# Patient Record
Sex: Male | Born: 1960 | Race: White | Hispanic: No | Marital: Married
Health system: Midwestern US, Community
[De-identification: ages and names within clinical notes are randomized; demographics above are authoritative.]

## PROBLEM LIST (undated history)

## (undated) DIAGNOSIS — L989 Disorder of the skin and subcutaneous tissue, unspecified: Secondary | ICD-10-CM

## (undated) DIAGNOSIS — F331 Major depressive disorder, recurrent, moderate: Secondary | ICD-10-CM

## (undated) DIAGNOSIS — F419 Anxiety disorder, unspecified: Secondary | ICD-10-CM

## (undated) DIAGNOSIS — Z5900 Homelessness unspecified: Secondary | ICD-10-CM

## (undated) DIAGNOSIS — Z20822 Contact with and (suspected) exposure to covid-19: Secondary | ICD-10-CM

## (undated) DIAGNOSIS — F32A Depression, unspecified: Secondary | ICD-10-CM

## (undated) HISTORY — DX: Depression, unspecified: F32.A

---

## 2004-07-14 ENCOUNTER — Ambulatory Visit: Payer: Self-pay | Admitting: Family Medicine

## 2005-03-04 ENCOUNTER — Ambulatory Visit: Payer: Self-pay | Admitting: Family Medicine

## 2012-09-21 ENCOUNTER — Ambulatory Visit: Payer: BC Managed Care – PPO | Admitting: Physical Therapy

## 2012-09-21 DIAGNOSIS — M25639 Stiffness of unspecified wrist, not elsewhere classified: Secondary | ICD-10-CM | POA: Insufficient documentation

## 2012-09-21 DIAGNOSIS — R5381 Other malaise: Secondary | ICD-10-CM | POA: Insufficient documentation

## 2012-09-21 DIAGNOSIS — M25649 Stiffness of unspecified hand, not elsewhere classified: Secondary | ICD-10-CM | POA: Insufficient documentation

## 2012-09-21 DIAGNOSIS — IMO0001 Reserved for inherently not codable concepts without codable children: Secondary | ICD-10-CM | POA: Insufficient documentation

## 2012-09-21 DIAGNOSIS — M25539 Pain in unspecified wrist: Secondary | ICD-10-CM | POA: Insufficient documentation

## 2012-09-21 DIAGNOSIS — M6281 Muscle weakness (generalized): Secondary | ICD-10-CM | POA: Insufficient documentation

## 2012-09-21 DIAGNOSIS — R209 Unspecified disturbances of skin sensation: Secondary | ICD-10-CM | POA: Insufficient documentation

## 2012-09-26 ENCOUNTER — Ambulatory Visit: Payer: BC Managed Care – PPO | Attending: Orthopedic Surgery | Admitting: Physical Therapy

## 2012-09-28 ENCOUNTER — Ambulatory Visit: Payer: BC Managed Care – PPO

## 2012-10-02 ENCOUNTER — Ambulatory Visit: Payer: BC Managed Care – PPO

## 2012-10-04 ENCOUNTER — Encounter: Payer: BC Managed Care – PPO | Admitting: Physical Therapy

## 2013-11-19 DIAGNOSIS — M25571 Pain in right ankle and joints of right foot: Secondary | ICD-10-CM | POA: Insufficient documentation

## 2014-01-14 DIAGNOSIS — N200 Calculus of kidney: Secondary | ICD-10-CM | POA: Insufficient documentation

## 2017-11-16 NOTE — Telephone Encounter (Signed)
Pt name and DOB verified.    Name of caller and relationship? Pt's wife Stephen Meadows     Who is your current or most recent primary care provider and which hospital where they are affiliated with? [facility]     How long did you see them for? About 1 year    Is there a reason that you are looking for a new provider? Needs provider closer to home.     Would you like to provide your e-mail to sign up for our My Chart system? Yes, [email or web address]     The benefits of being a My Chart member are:    -Request medical appointments  -View your health summary from the My Chart electronic health record.  -View test results.  -Request prescription refills  -Communicate electronically and securely with your medical care team.    Would you like a text message or letter with your activation code? Yes - Text      When was your last Annual or Well Child Exam? Earlier this year , Please keep in mind that this new patient appointment may not contain an annual exam.     Do you have any ongoing or chronic conditions you are currently being treated   for (e.g. Hypertension, Diabetes, Depression, etc)? If yes list: yes, broken rt foot, complex regional pain syndrome, pt has a spinal stimulator, borderline diabetes, depression and diverticulitis.     Have you been seen by a provider for these chronic conditions in the last 6 months? If so, who? yes    Are you on any medicines that require a special prescription such as a sleeping pills, pain medication or stimulants? NO  (if no go ahead and book appointment, if yes please read the following:    "Please be aware that our provider's may not prescribe these medications at the first visit as they need to get to know more about you and your medical problems/history before they will safely prescribe these" Informed  yes    Do you need an appointment to establish care or is there something more    urgent you need to be seen for? Explain: Establish and depression.     Do you have a provider preference such as male or male? No preference.     Do you have any questions about what we discussed? no  (If no go ahead and book, if yes please get the details and send to the practice)    Your new patient appointment is booked on 05/08/18 at 9:45 with Robin Dobrinick.     We do ask if you're not able to keep this appointment please call our office within 24 hours to reschedule to a more convenient time which also allows us the opportunity free up the slot for another patient.       **Thank you for choosing us for your health care needs.   [name] I do want to let you know that you will need to contact your insurance company and make them aware of your new Primary Care Provider which is Dobrinick, Jamison Neighborobin A, FNP. Also [name] a new patient packet will be coming to you. Would you like to have this release of information faxed to you? yes If faxed what is the number? [phone].  If not, you can stop by our practice to complete the form or we can mail you the form.  We would greatly appreciate it if you could complete the release of records and either mail back or drop off at our office within 7 days of receipt.    Notes: Pt has Medicare and [employer] for insurance. Pt will bring his insurance cards to the appt.     CB#: [phone]     Sol Blazing

## 2017-11-17 NOTE — Telephone Encounter (Signed)
NP packet sent, appointment date and time confirmed. I will forward to care teas as FYI.

## 2017-11-24 LAB — HM COLONOSCOPY

## 2018-03-10 NOTE — Telephone Encounter (Signed)
Pt name and DOB verified.    Pt's wife, [name] calling in to see if pt can be seen any sooner than his NP appt. Wife states pt is extremely depressed. Wife denies pt having an suicidal or homicidal thoughts.    Writer did place NP appt on wait list.    Please advise and call [name] back.    [phone] - Stephen Meadows

## 2018-03-10 NOTE — Telephone Encounter (Signed)
Spoke with wife about pt's depression. He broke his foot and was put into a cast then needed to have screws put in his foot which put him out of helping around the house for another 8 weeks.    Pt feels like he cannot help around the house and is getting depressed. He is not having any suicidal thoughts.  I informed wife we did not have any availability to get pt in sooner but I did tell her I was going to put him on the waitlist and make sure he is the first person we call if we do have a cancellation.     She voiced good understanding and will wait for a call.

## 2018-03-13 NOTE — Telephone Encounter (Addendum)
I spoke with Stephen Meadows his wife and I let her know he could be seen for his depression only next week and then he can keep his appt on 5/4 for his NP appointment and she acknowledged.

## 2018-03-22 ENCOUNTER — Encounter: Attending: Family | Primary: Family

## 2018-03-22 ENCOUNTER — Ambulatory Visit: Admit: 2018-03-22 | Discharge: 2018-03-22 | Payer: MEDICARE | Attending: Family | Primary: Family

## 2018-03-22 ENCOUNTER — Ambulatory Visit: Attending: Family

## 2018-03-22 DIAGNOSIS — F331 Major depressive disorder, recurrent, moderate: Secondary | ICD-10-CM

## 2018-03-22 MED ORDER — VENLAFAXINE 37.5 MG TAB
37.5 mg | ORAL_TABLET | Freq: Three times a day (TID) | ORAL | 3 refills | Status: DC
Start: 2018-03-22 — End: 2019-01-02

## 2018-03-22 MED ORDER — BUPROPION XL 150 MG 24 HR TAB
150 mg | ORAL_TABLET | ORAL | 1 refills | Status: DC
Start: 2018-03-22 — End: 2018-12-26

## 2018-03-22 NOTE — Progress Notes (Signed)
[facility]   [address] Stephen Meadows  [place] [address]  [phone]    CHIEF COMPLAINT     Stephen Meadows is a 58 y.o. male who presents to the office today for an acute visit for Depression.    HPI     I had the pleasure of meeting Stephen Meadows today. He is a pleasant 32 year old caucasian male with a history of depression and complex regional pain syndrome. He presents for evaluation of depression. He is accompanied by his wife.     Stephen Meadows has been taking Effexor 37.5mg  three times a day for years to manage his depression. He feels it works well for him and says he feels physically sick if he misses a dose. He feels his symptoms are worsening over the past several months. He fell off a ladder and shattered his right heel. He had surgery to repair it and feels frustrated that he can't help his wife around the house. He is feeling very mood and irritable. He has never been violent and denies SI or HI, but he says people get scared of him when he is angry. He feels he has a lack of energy. He sleeps well all night, but still feels bored and tired during the day time. He doesn't feel like doing anything. He enjoys riding motorcycles and doing outside work- and is missing that. He says he wakes up on the wrong side of the bed most mornings and isn't sure why he is in such a bad mood.    His wife thinks he may be on the spectrum. She says he has always exhibited odd behavior- particular with social interactions. He doesn't like to be around people. He doesn't understand sarcasm. She has to be very careful how she words things so as not to upset him. He adheres to a strict schedule and has a hard time coping with sudden change. Sometimes he'll do things like reach over and twirl her hair when they are in a meeting or appointment together. The other night they had a dinner party and he sat in his chair the whole time. She is requesting a neuropsychiatric evaluation. Maddox does  not object to this. He admits that he has a hard time trusting and relating to people. He denies any prior diagnosis of ASD or history of psychiatric admissions. He has a high school education.     3 most recent PHQ Screens 03/22/2018   Little interest or pleasure in doing things Nearly every day   Feeling down, depressed, irritable, or hopeless More than half the days   Total Score PHQ 2 5   Trouble falling or staying asleep, or sleeping too much More than half the days   Feeling tired or having little energy More than half the days   Poor appetite, weight loss, or overeating More than half the days   Feeling bad about yourself - or that you are a failure or have let yourself or your family down More than half the days   Trouble concentrating on things such as school, work, reading, or watching TV More than half the days   Moving or speaking so slowly that other people could have noticed; or the opposite being so fidgety that others notice Nearly every day   Thoughts of being better off dead, or hurting yourself in some way Not at all   PHQ 9 Score 18   How difficult have these problems made it  for you to do your work, take care of your home and get along with others Very difficult     GAD 2/7 03/22/2018   Feeling nervous, anxious or on edge? 1   Not being able to stop or control worrying? 1   GAD-2 Subtotal 2     MEDICATIONS     Current Outpatient Medications   Medication Sig   ??? buPROPion XL (WELLBUTRIN XL) 150 mg tablet Take 1 Tab by mouth every morning. Indications: major depressive disorder   ??? venlafaxine (EFFEXOR) 37.5 mg tablet Take 1 Tab by mouth three (3) times daily. Indications: major depressive disorder     No current facility-administered medications for this visit.      Medications Discontinued During This Encounter   Medication Reason   ??? venlafaxine (EFFEXOR) 37.5 mg tablet Reorder       ALLERGIES     No Known Allergies    REVIEW OF SYSTEMS   See HPI    ACTIVE MEDICAL PROBLEMS     Patient Active  Problem List   Diagnosis Code   ??? Moderate episode of recurrent major depressive disorder (HCC) F33.1   ??? Prediabetes R73.03   ??? 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       SOCIAL HISTORY     Social History     Social History Narrative    Marital status: Married, Stephen Meadows - 9 years    Living situation: Own home in [place]     Pets in the home: 2 dogs, 1 cat     Children: None- 2 step children     Occupation: Disabled    Highest education: HS    Alcohol,tobacco, drug [id number] and marijuana use     Caffeine/ diet: Keto,  Decaf coffee    Exercise: None     Military- None              VITALS     Vitals:    03/22/18 1154   BP: 110/84   Pulse: 86   Resp: 12   SpO2: 96%   Weight: 188 lb 12.8 oz (85.6 kg)   Height: 5\' 9"  (1.753 m)     Body mass index is 27.88 kg/m??.    PHYSICAL EXAM     MENTAL STATUS EXAM:  General Appearance:   Neatly dressed, good hygiene, calm, cooperative, polite  Speech:   Coherent, non-pressured, regular tone rate and volume  Psychomotor:   Good eye contact, normal motor activity, no involuntary movements  Mood:   Euthymic  Affect:   Mood congruent  Thought Process:   Goal-directed and organized, without any evidence of mania or psychosis  Thought Content:   No AV hallucinations or delusional thoughts.  Suicidal Thoughts:   Denies any thoughts of suicide or self-harm.  Homicidal/Violent Thoughts:   Denies any homicidal or violent thoughts  Orientation:   Generally alert and oriented  Insight/Judgement:     Insight and judgment seem intact  Estimated Intelligence:   below average    LABS & IMAGING   No results found for this or any previous visit (from the past 24 hour(s)).    ORDERS & MEDS (NEW)     Orders Placed This Encounter   ??? INFLUENZA VIRUS VACCINE QUADRIVALENT, PRESERVATIVE FREE SYRINGE (50277)   ??? METABOLIC PANEL, COMPREHENSIVE     Standing Status:   Future     Standing Expiration Date:   03/22/2019   ??? HEMOGLOBIN A1C WITH EAG  Standing Status:   Future     Standing  Expiration Date:   03/22/2019   ??? LIPID PANEL W/ REFLX DIRECT LDL     Standing Status:   Future     Standing Expiration Date:   03/22/2019   ??? TSH CASCADE     Standing Status:   Future     Standing Expiration Date:   03/22/2019   ??? Referral to [facility]     Referral Priority:   Routine     Referral Type:   Behavioral Health     Referral Reason:   Specialty Services Required     Referral Location:   [facility]     Requested Specialty:   Psychology     Number of Visits Requested:   1   ??? REFERRAL TO BEHAVIORAL HEALTH     Referral Priority:   Routine     Referral Type:   Behavioral Health     Referral Reason:   Specialty Services Required     Number of Visits Requested:   1   ??? buPROPion XL (WELLBUTRIN XL) 150 mg tablet     Sig: Take 1 Tab by mouth every morning. Indications: major depressive disorder     Dispense:  90 Tab     Refill:  1   ??? venlafaxine (EFFEXOR) 37.5 mg tablet     Sig: Take 1 Tab by mouth three (3) times daily. Indications: major depressive disorder     Dispense:  270 Tab     Refill:  3     Handicap lids       ASSESSMENT AND PLAN     Diagnoses and all orders for this visit:    1. Moderate episode of recurrent major depressive disorder [facility])  Assessment & Plan:  Xhaiden reports feeling depressed. Predominant symptoms include lethargy and lack of motivation. He reports moodiness and irritability as well. He is sleeping well. He denies SI/HI and has no history of bipolar or psychiatric admissions. He recently quit smoking. I have added bupropion  to his regimen. If he notes increased agitation or irritability he will call. I have also referred him for counseling. He and his wife are requesting a full neuropsychiatric evaluation to rule out ASD. I have made the referral. Follow-up in two months.     Orders:  -     REFERRAL TO PSYCHOLOGY  -     buPROPion XL (WELLBUTRIN XL) 150 mg tablet; Take 1 Tab by mouth every morning. Indications:  major depressive disorder  -     REFERRAL TO BEHAVIORAL HEALTH  -     TSH CASCADE; Future  -     venlafaxine (EFFEXOR) 37.5 mg tablet; Take 1 Tab by mouth three (3) times daily. Indications: major depressive disorder    2. Encounter for immunization  -     INFLUENZA VIRUS VAC QUAD,SPLIT,PRESV FREE SYRINGE IM    3. Prediabetes  -     METABOLIC PANEL, COMPREHENSIVE; Future  -     HEMOGLOBIN A1C WITH EAG; Future    4. Lipid screening  -     LIPID PANEL W/ REFLX DIRECT LDL; Future    Follow-up and Dispositions    ?? Return if symptoms worsen or fail to improve.       Greater than 30 minutes were spent during this consultation of that greater than 50% of the time was spent in direct coordination of care and patient education for  the above listed problems.     Future Appointments   Date Time Provider Department Center   05/08/2018  9:45 AM Jashiya Bassett, Jamison Neighbor, FNP [facility]       Jamison Neighbor Aqil Goetting, FNP  03/22/2018

## 2018-03-22 NOTE — Assessment & Plan Note (Signed)
Stephen Meadows reports feeling depressed. Predominant symptoms include lethargy and lack of motivation. He reports moodiness and irritability as well. He is sleeping well. He denies SI/HI and has no history of bipolar or psychiatric admissions. He recently quit smoking. I have added bupropion 150mg  to his regimen. If he notes increased agitation or irritability he will call. I have also referred him for counseling. He and his wife are requesting a full neuropsychiatric evaluation to rule out ASD. I have made the referral. Follow-up in two months.

## 2018-03-22 NOTE — Progress Notes (Signed)
[facility] AT [name]   [address] [name]  [place] [address]  [phone]    CHIEF COMPLAINT     Stephen Meadows is a 58 y.o. male who presents to the office today for an acute visit for Depression.    HPI     I had the pleasure of meeting Stephen Meadows today. He is a pleasant 71 year old caucasian male with a history of depression and complex regional pain syndrome. He presents for evaluation of depression. He is accompanied by his wife.     Stephen Meadows has been taking Effexor 37.5mg  three times a day for years to manage his depression. He feels it works well for him and says he feels physically sick if he misses a dose. He feels his symptoms are worsening over the past several months. He fell off a ladder and shattered his right heel. He had surgery to repair it and feels frustrated that he can't help his wife around the house. He is feeling very mood and irritable. He has never been violent and denies SI or HI, but he says people get scared of him when he is angry. He feels he has a lack of energy. He sleeps well all night, but still feels bored and tired during the day time. He doesn't feel like doing anything. He enjoys riding motorcycles and doing outside work- and is missing that. He says he wakes up on the wrong side of the bed most mornings and isn't sure why he is in such a bad mood.    His wife thinks he may be on the spectrum. She says he has always exhibited odd behavior- particular with social interactions. He doesn't like to be around people. He doesn't understand sarcasm. She has to be very careful how she words things so as not to upset him. He adheres to a strict schedule and has a hard time coping with sudden change. Sometimes he'll do things like reach over and twirl her hair when they are in a meeting or appointment together. The other night they had a dinner party and he sat in his chair the whole time. She is requesting a  neuropsychiatric evaluation. Jigar does not object to this. He admits that he has a hard time trusting and relating to people. He denies any prior diagnosis of ASD or history of psychiatric admissions. He has a high school education.     3 most recent PHQ Screens 03/22/2018   Little interest or pleasure in doing things Nearly every day   Feeling down, depressed, irritable, or hopeless More than half the days   Total Score PHQ 2 5   Trouble falling or staying asleep, or sleeping too much More than half the days   Feeling tired or having little energy More than half the days   Poor appetite, weight loss, or overeating More than half the days   Feeling bad about yourself - or that you are a failure or have let yourself or your family down More than half the days   Trouble concentrating on things such as school, work, reading, or watching TV More than half the days   Moving or speaking so slowly that other people could have noticed; or the opposite being so fidgety that others notice Nearly every day   Thoughts of being better off dead, or hurting yourself in some way Not at all   PHQ 9 Score 18   How difficult have these problems made it  for you to do your work, take care of your home and get along with others Very difficult     GAD 2/7 03/22/2018   Feeling nervous, anxious or on edge? 1   Not being able to stop or control worrying? 1   GAD-2 Subtotal 2     MEDICATIONS     Current Outpatient Medications   Medication Sig   ??? buPROPion XL (WELLBUTRIN XL) 150 mg tablet Take 1 Tab by mouth every morning. Indications: major depressive disorder   ??? venlafaxine (EFFEXOR) 37.5 mg tablet Take 1 Tab by mouth three (3) times daily. Indications: major depressive disorder     No current facility-administered medications for this visit.      Medications Discontinued During This Encounter   Medication Reason   ??? venlafaxine (EFFEXOR) 37.5 mg tablet Reorder       ALLERGIES     No Known Allergies    REVIEW OF SYSTEMS   See HPI     ACTIVE MEDICAL PROBLEMS     Patient Active Problem List   Diagnosis Code   ??? Moderate episode of recurrent major depressive disorder (HCC) F33.1   ??? Prediabetes R73.03   ??? 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       SOCIAL HISTORY     Social History     Social History Narrative    Marital status: Married, Stephen Meadows - 9 years    Living situation: Own home in [place]     Pets in the home: 2 dogs, 1 cat     Children: None- 2 step children     Occupation: Disabled    Highest education: HS    Alcohol,tobacco, drug [id number] and marijuana use     Caffeine/ diet: Keto,  Decaf coffee    Exercise: None     Military- None              VITALS     Vitals:    03/22/18 1154   BP: 110/84   Pulse: 86   Resp: 12   SpO2: 96%   Weight: 188 lb 12.8 oz (85.6 kg)   Height: 5\' 9"  (1.753 m)     Body mass index is 27.88 kg/m??.    PHYSICAL EXAM     MENTAL STATUS EXAM:  General Appearance:   Neatly dressed, good hygiene, calm, cooperative, polite  Speech:   Coherent, non-pressured, regular tone rate and volume  Psychomotor:   Good eye contact, normal motor activity, no involuntary movements  Mood:   Euthymic  Affect:   Mood congruent  Thought Process:   Goal-directed and organized, without any evidence of mania or psychosis  Thought Content:   No AV hallucinations or delusional thoughts.  Suicidal Thoughts:   Denies any thoughts of suicide or self-harm.  Homicidal/Violent Thoughts:   Denies any homicidal or violent thoughts  Orientation:   Generally alert and oriented  Insight/Judgement:     Insight and judgment seem intact  Estimated Intelligence:   below average    LABS & IMAGING   No results found for this or any previous visit (from the past 24 hour(s)).    ORDERS & MEDS (NEW)     Orders Placed This Encounter   ??? INFLUENZA VIRUS VACCINE QUADRIVALENT, PRESERVATIVE FREE SYRINGE (77373)   ??? METABOLIC PANEL, COMPREHENSIVE     Standing Status:   Future     Standing Expiration Date:   03/22/2019   ??? HEMOGLOBIN A1C WITH EAG  Standing Status:   Future     Standing Expiration Date:   03/22/2019   ??? LIPID PANEL W/ REFLX DIRECT LDL     Standing Status:   Future     Standing Expiration Date:   03/22/2019   ??? TSH CASCADE     Standing Status:   Future     Standing Expiration Date:   03/22/2019   ??? Referral to [facility]     Referral Priority:   Routine     Referral Type:   Behavioral Health     Referral Reason:   Specialty Services Required     Referral Location:   [facility]     Requested Specialty:   Psychology     Number of Visits Requested:   1   ??? REFERRAL TO BEHAVIORAL HEALTH     Referral Priority:   Routine     Referral Type:   Behavioral Health     Referral Reason:   Specialty Services Required     Number of Visits Requested:   1   ??? buPROPion XL (WELLBUTRIN XL) 150 mg tablet     Sig: Take 1 Tab by mouth every morning. Indications: major depressive disorder     Dispense:  90 Tab     Refill:  1   ??? venlafaxine (EFFEXOR) 37.5 mg tablet     Sig: Take 1 Tab by mouth three (3) times daily. Indications: major depressive disorder     Dispense:  270 Tab     Refill:  3     Handicap lids       ASSESSMENT AND PLAN     Diagnoses and all orders for this visit:    1. Moderate episode of recurrent major depressive disorder [facility])  Assessment & Plan:  Stephen Meadows reports feeling depressed. Predominant symptoms include lethargy and lack of motivation. He reports moodiness and irritability as well. He is sleeping well. He denies SI/HI and has no history of bipolar or psychiatric admissions. He recently quit smoking. I have added bupropion  to his regimen. If he notes increased agitation or irritability he will call. I have also referred him for counseling. He and his wife are requesting a full neuropsychiatric evaluation to rule out ASD. I have made the referral. Follow-up in two months.     Orders:  -     REFERRAL TO PSYCHOLOGY   -     buPROPion XL (WELLBUTRIN XL) 150 mg tablet; Take 1 Tab by mouth every morning. Indications: major depressive disorder  -     REFERRAL TO BEHAVIORAL HEALTH  -     TSH CASCADE; Future  -     venlafaxine (EFFEXOR) 37.5 mg tablet; Take 1 Tab by mouth three (3) times daily. Indications: major depressive disorder    2. Encounter for immunization  -     INFLUENZA VIRUS VAC QUAD,SPLIT,PRESV FREE SYRINGE IM    3. Prediabetes  -     METABOLIC PANEL, COMPREHENSIVE; Future  -     HEMOGLOBIN A1C WITH EAG; Future    4. Lipid screening  -     LIPID PANEL W/ REFLX DIRECT LDL; Future    Follow-up and Dispositions    ?? Return if symptoms worsen or fail to improve.       Greater than 30 minutes were spent during this consultation of that greater than 50% of the time was spent in direct coordination of care and patient education for the  above listed problems.     Future Appointments   Date Time Provider Department Center   05/08/2018  9:45 AM Jatinder Mcdonagh, Jamison Neighbor, FNP [facility]       Jamison Neighbor Rohn Fritsch, FNP  03/22/2018

## 2018-03-22 NOTE — Patient Instructions (Addendum)
Continue with Effexor 37.5mg  three times a day.  Start bupropion 150mg  once a day in the mornings.   I have referred you for counseling at [facility]. They will be sending you a letter with a phone number to call and schedule your first appointment.       Emergency 911 Immediate Help   Behavioral Emergency Department [phone]  [address]  [place] Want to hurt myself/others  Feel unsafe  What services are available to me?   Statewide Crisis Hotline 7182235986 Concerned for self or others   Suicide Prevention Line 7821445474 Thinking of hurting myself   Intentional Warm Line [phone] Support during non-crisis situations   Crisis Text Line Text HOME to 9513439565  http://cook.com/ Concerned for self or others   Safe Voices Domestic Violence  24 Hour Helpline [phone] Domestic Violence Support   Sexual Assault Prevention & Response Services 24 Hour Helpline [phone] Sexual Assault Support

## 2018-03-22 NOTE — Assessment & Plan Note (Signed)
Stephen Meadows reports feeling depressed. Predominant symptoms include lethargy and lack of motivation. He reports moodiness and irritability as well. He is sleeping well. He denies SI/HI and has no history of bipolar or psychiatric admissions. He recently quit smoking. I have added bupropion 150mg to his regimen. If he notes increased agitation or irritability he will call. I have also referred him for counseling. He and his wife are requesting a full neuropsychiatric evaluation to rule out ASD. I have made the referral. Follow-up in two months.

## 2018-04-25 NOTE — Telephone Encounter (Signed)
Patient's spouse called [facility] and was told that they do not accept Medicare or his [place].  Juanita, please advise if you know of another practice that we can refer patient to for testing.

## 2018-04-25 NOTE — Telephone Encounter (Signed)
Pt name and DOB verified.      Pt is calling stating he spoke to [facility] or Outpatient and they have not received his referral yet.      Please advise  [phone] (home)   Stephen Meadows

## 2018-04-25 NOTE — Telephone Encounter (Addendum)
I checked the referral to [facility].  They have called the patient 3x and sent a letter for him to call for an appt.  The RF for psychodiagnostic testing was also processed to [facility] and faxed on 03/30/18.    I called patient back and spoke with his spouse.  She will call [facility] back to schedule counseling.  I gave her the number to [facility] and she will call them to check on an appointment.

## 2018-04-26 NOTE — Telephone Encounter (Signed)
Yes, please change referral to Dr. Sherril Croon.  I have informed the patient and his spouse that we will change the referral to them.  I also gave them her phone number to follow up for an appt in 1-week.

## 2018-04-27 NOTE — Telephone Encounter (Signed)
Note.  Thank you.

## 2018-04-27 NOTE — Telephone Encounter (Signed)
Hi Kim, I have refaxed this referral to Dr. Corinda Gubler A. Wiktor, PsyD., [address], [address], [place] [address]    Ph:  [phone] this morning.    Thank you!    Eunice Blase

## 2018-05-08 ENCOUNTER — Telehealth: Admit: 2018-05-08 | Discharge: 2018-05-08 | Payer: MEDICARE | Attending: Family | Primary: Family

## 2018-05-08 ENCOUNTER — Telehealth: Attending: Family

## 2018-05-08 DIAGNOSIS — F331 Major depressive disorder, recurrent, moderate: Secondary | ICD-10-CM

## 2018-05-08 MED ORDER — HYDROXYZINE PAMOATE 25 MG CAP
25 mg | ORAL_CAPSULE | Freq: Every evening | ORAL | 3 refills | Status: DC | PRN
Start: 2018-05-08 — End: 2018-05-12

## 2018-05-08 NOTE — Progress Notes (Signed)
[facility]   [address]  [place] [id number]  [phone]      TELEMEDICINE VISIT (Performed via Telephone or Audio & Video)     DEVICE PREFERENCE:  Telephone  INVITE METHOD:    Provider to call Mobile Phone:   Telephone Information:   Mobile [phone]     METHOD OF VISIT:  Audio only (Telephone) - patient cannot do a video visit due to (lack of equipment, data or connectivity)  PROVIDER LOCATION:  Home  PATIENT LOCATION AT TIME OF VISIT (PHYSICAL ADDRESS):  TOWN:  [place]  STATE:  [place]  PARTICIPANT(S):  Patient and Family member: wife- Stephen Meadows  (name)    BILLING CODE:  Telephone Encounter:  [phone]: 21-30 Minutes       (Commercial, Medicaid, or Medicare) Telephone evaluation and management service provided by a physician or other qualified health care professional who may report evaluation and management services provided to an established patient, parent, or guardian not originating from a related E/M service provided within the previous 7 days nor leading to an E/M service or procedure within the next 24 hours or soonest available appointment.  TOTAL TIME SPENT WITH ENCOUNTER:  25 minutes    ??? Review of electronic health record:  o I specifically reviewed: Previous Office Encounters  ??? Problems, Meds and Allergies were reviewed.  ??? Patient gave verbal consent for this TeleHealth visit.    CHIEF COMPLAINT   Stephen Meadows is a 58 y.o. male who presents for a Tele-Health visit today for Depression.    HISTORY OF PRESENT ILLNESS     I had the pleasure of speaking with Stephen Meadows today. He is a pleasant 58 year old caucasian male with a history of depression and complex regional pain syndrome. He presents for evaluation of depression. Due to the COVID19 pandemic we are conducting this visit via telephone today. His wife joins us on the call.     He says he's been doing pretty good. He's been getting outside a lot and feels the Wellbutrin is working. He's not as angry or  irritable as he had been and his wife says she notices a dramatic improvement as well. He wonders if he needs another medicine for anxiety. He sometimes feels irritated in the evenings and has been having a hard time sleeping because of it. He has a very hard time explaining what he means by this, but thinks he is feeling anxious. "Maybe I'm worried about the virus- or my Daddy." His father is [phone] and he worries about his health. It is hard not being able to see him. [place] in [place] and they haven't seen each other in two years.      DEPRESSION SCREENING  Severity:  0-4 none; 5-9 mild; 10-14 moderate; 15-19 moderately severe; 20-27 severe  3 most recent PHQ Screens 05/08/2018 03/22/2018   Little interest or pleasure in doing things Not at all Nearly every day   Feeling down, depressed, irritable, or hopeless More than half the days More than half the days   Total Score PHQ 2 2 5    Trouble falling or staying asleep, or sleeping too much Several days More than half the days   Feeling tired or having little energy Several days More than half the days   Poor appetite, weight loss, or overeating More than half the days More than half the days   Feeling bad about yourself - or that you are a failure or have  let yourself or your family down Not at all More than half the days   Trouble concentrating on things such as school, work, reading, or watching TV Not at all More than half the days   Moving or speaking so slowly that other people could have noticed; or the opposite being so fidgety that others notice Several days Nearly every day   Thoughts of being better off dead, or hurting yourself in some way Not at all Not at all   PHQ 9 Score 7 18   How difficult have these problems made it for you to do your work, take care of your home and get along with others Somewhat difficult Very difficult     Nocturia:  Stephen Meadows says he's been getting up about 3-4 times a night to urinate for the past few years and it seems to be  getting wore. He says he hasn't had a good night's sleep in two years. He urinates frequently during the daytime as well and says if he hears the faucet running he needs to go right away. He deneies dysuria or incontinence. He reports urinary hesitancy and a weak stream. "Sometimes it feels like a drizzle." Both of his parents had type II diabetes. He has a reported history of prediabetes but has not had A1C testing in awhile. There is no baseline on file and he did not go for his labs.    MEDICATIONS     Current Outpatient Medications   Medication Sig   ??? ibuprofen (MOTRIN) 200 mg tablet Take 400 mg by mouth every eight (8) hours as needed (foot pain).   ??? hydrOXYzine pamoate (VISTARIL) 25 mg capsule Take 1-2 Caps by mouth nightly as needed for Anxiety or Sleep.   ??? buPROPion XL (WELLBUTRIN XL) 150 mg tablet Take 1 Tab by mouth every morning. Indications: major depressive disorder   ??? venlafaxine (EFFEXOR) 37.5 mg tablet Take 1 Tab by mouth three (3) times daily. Indications: major depressive disorder     No current facility-administered medications for this visit.      There are no discontinued medications.    ALLERGIES   No Known Allergies    REVIEW OF SYSTEMS   See HPI    ACTIVE MEDICAL PROBLEMS     Patient Active Problem List   Diagnosis Code   ??? Moderate episode of recurrent major depressive disorder (HCC) F33.1   ??? Prediabetes R73.03   ??? 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 Nocturia R35.1   ??? Anxiety F41.9       SOCIAL HISTORY     Social History     Social History Narrative    Marital status: Married, Stephen Meadows - 9 years    Living situation: Own home in [place]     Pets in the home: 2 dogs, 1 cat     Children: None- 2 step children     Occupation: Disabled    Highest education: HS    Alcohol,tobacco, drug XLK:GMWNUU and marijuana use     Caffeine/ diet: Keto,  Decaf coffee    Exercise: None     Military- None                VITALS   There were no vitals filed for this visit.  There is no  height or weight on file to calculate BMI.    BP Readings from Last 3 Encounters:   03/22/18 110/84     Wt Readings from Last 3  Encounters:   03/22/18 188 lb 12.8 oz (85.6 kg)       PHYSICAL EXAM     Audio only encounter with no pertinent exam findings    ORDERS & MEDS (NEW)     Orders Placed This Encounter   ??? PSA SCREENING (SCREENING)     Standing Status:   Future     Standing Expiration Date:   11/08/2019   ??? hydrOXYzine pamoate (VISTARIL) 25 mg capsule     Sig: Take 1-2 Caps by mouth nightly as needed for Anxiety or Sleep.     Dispense:  60 Cap     Refill:  3       ASSESSMENT AND PLAN     Diagnoses and all orders for this visit:    1. Moderate episode of recurrent major depressive disorder [facility])  Assessment & Plan:  The addition of Wellbutrin has been beneficial. He and his wife have both noticed improved mood and decreased irritability. His neuropsychiatric consult has not been scheduled- but they have been contacted. No changes. Continue current regimen and follow-up in one month.       2. Anxiety  Assessment & Plan:  Marta reports anxiety in the evenings that is interfering with sleep. Start hydroxyzine 25-50mg  QHS. Follow-up one month.     Orders:  -     hydrOXYzine pamoate (VISTARIL) 25 mg capsule; Take 1-2 Caps by mouth nightly as needed for Anxiety or Sleep.    3. Nocturia  Assessment & Plan:  Patient reports nocturia 3-4 times per night with associated urinary hesitancy and weak stream. He reports urinary frequency during the daytime as well.  He denies dysuria, hematuria, flank pain, or incomplete bladder emptying. I do not suspect UTI. BPH is more likely. He has a strong family history of diabetes and this needs to be ruled out as well. This has been a chronic issue for several years.  We will plan to check an A1C and PSA. Follow-up in 1 month.    Orders:  -     PSA SCREENING (SCREENING); Future    4. Encounter for screening for malignant neoplasm of prostate   -     PSA SCREENING (SCREENING);  Future    Follow-up and Dispositions    ?? Return in about 1 month (around 06/08/2018) for anxiety, depression, nocturia.         No future appointments.    Jamison Neighbor Raghad Lorenz, FNP  05/08/2018

## 2018-05-08 NOTE — Assessment & Plan Note (Signed)
Rece reports anxiety in the evenings that is interfering with sleep. Start hydroxyzine 25-50mg  QHS. Follow-up one month.

## 2018-05-08 NOTE — Assessment & Plan Note (Signed)
The addition of Wellbutrin has been beneficial. He and his wife have both noticed improved mood and decreased irritability. His neuropsychiatric consult has not been scheduled- but they have been contacted. No changes. Continue current regimen and follow-up in one month.

## 2018-05-08 NOTE — Assessment & Plan Note (Signed)
Patient reports nocturia 3-4 times per night with associated urinary hesitancy and weak stream. He reports urinary frequency during the daytime as well.  He denies dysuria, hematuria, flank pain, or incomplete bladder emptying. I do not suspect UTI. BPH is more likely. He has a strong family history of diabetes and this needs to be ruled out as well. This has been a chronic issue for several years.  We will plan to check an A1C and PSA. Follow-up in 1 month.

## 2018-05-08 NOTE — Assessment & Plan Note (Signed)
The addition of Wellbutrin has been beneficial. He and his wife have both noticed improved mood and decreased irritability. His neuropsychiatric consult has not been scheduled- but they have been contacted. No changes. Continue current regimen and follow-up in one month.

## 2018-05-08 NOTE — Assessment & Plan Note (Addendum)
Hassell reports anxiety in the evenings that is interfering with sleep. Start hydroxyzine 25-50mg QHS. Follow-up one month.

## 2018-05-08 NOTE — Progress Notes (Signed)
[facility]   [address]  [place] [address]  [phone]      TELEMEDICINE VISIT (Performed via Telephone or Audio & Video)     DEVICE PREFERENCE:  Telephone  INVITE METHOD:    Provider to call Mobile Phone:   Telephone Information:   Mobile [phone]     METHOD OF VISIT:  Audio only (Telephone) - patient cannot do a video visit due to (lack of equipment, data or connectivity)  PROVIDER LOCATION:  Home  PATIENT LOCATION AT TIME OF VISIT (PHYSICAL ADDRESS):  TOWN:  [place]  STATE:  [place]  PARTICIPANT(S):  Patient and Family member: wife- Anastasia Fiedler  (name)    BILLING CODE:  Telephone Encounter:  [phone]: 21-30 Minutes       (Commercial, Medicaid, or Medicare) Telephone evaluation and management service provided by a physician or other qualified health care professional who may report evaluation and management services provided to an established patient, parent, or guardian not originating from a related E/M service provided within the previous 7 days nor leading to an E/M service or procedure within the next 24 hours or soonest available appointment.  TOTAL TIME SPENT WITH ENCOUNTER:  25 minutes    ? Review of electronic health record:  o I specifically reviewed: Previous Office Encounters  ? Problems, Meds and Allergies were reviewed.  ? Patient gave verbal consent for this TeleHealth visit.    CHIEF COMPLAINT   Stephen Meadows is a 58 y.o. male who presents for a Tele-Health visit today for Depression.    HISTORY OF PRESENT ILLNESS     I had the pleasure of speaking with Stephen Meadows today. He is a pleasant 61 year old caucasian male with a history of depression and complex regional pain syndrome. He presents for evaluation of depression. Due to the COVID19 pandemic we are conducting this visit via telephone today. His wife joins [name] on the call.     He says he's been doing pretty good. He's been getting outside a lot and  feels the Wellbutrin is working. He's not as angry or irritable as he had been and his wife says she notices a dramatic improvement as well. He wonders if he needs another medicine for anxiety. He sometimes feels irritated in the evenings and has been having a hard time sleeping because of it. He has a very hard time explaining what he means by this, but thinks he is feeling anxious. "Maybe I'm worried about the virus- or my Daddy." His father is 38 and he worries about his health. It is hard not being able to see him. He lives in [place] and they haven't seen each other in two years.      DEPRESSION SCREENING  Severity:  0-4 none; 5-9 mild; 10-14 moderate; 15-19 moderately severe; 20-27 severe  3 most recent PHQ Screens 05/08/2018 03/22/2018   Little interest or pleasure in doing things Not at all Nearly every day   Feeling down, depressed, irritable, or hopeless More than half the days More than half the days   Total Score PHQ 2 2 5    Trouble falling or staying asleep, or sleeping too much Several days More than half the days   Feeling tired or having little energy Several days More than half the days   Poor appetite, weight loss, or overeating More than half the days More than half the days   Feeling bad about yourself - or that you are a failure or have  let yourself or your family down Not at all More than half the days   Trouble concentrating on things such as school, work, reading, or watching TV Not at all More than half the days   Moving or speaking so slowly that other people could have noticed; or the opposite being so fidgety that others notice Several days Nearly every day   Thoughts of being better off dead, or hurting yourself in some way Not at all Not at all   PHQ 9 Score 7 18   How difficult have these problems made it for you to do your work, take care of your home and get along with others Somewhat difficult Very difficult     Nocturia:   Stephen Meadows says he's been getting up about 3-4 times a night to urinate for the past few years and it seems to be getting wore. He says he hasn't had a good night's sleep in two years. He urinates frequently during the daytime as well and says if he hears the faucet running he needs to go right away. He deneies dysuria or incontinence. He reports urinary hesitancy and a weak stream. "Sometimes it feels like a drizzle." Both of his parents had type II diabetes. He has a reported history of prediabetes but has not had A1C testing in awhile. There is no baseline on file and he did not go for his labs.    MEDICATIONS     Current Outpatient Medications   Medication Sig   ??? ibuprofen (MOTRIN) 200 mg tablet Take 400 mg by mouth every eight (8) hours as needed (foot pain).   ??? hydrOXYzine pamoate (VISTARIL) 25 mg capsule Take 1-2 Caps by mouth nightly as needed for Anxiety or Sleep.   ??? buPROPion XL (WELLBUTRIN XL) 150 mg tablet Take 1 Tab by mouth every morning. Indications: major depressive disorder   ??? venlafaxine (EFFEXOR) 37.5 mg tablet Take 1 Tab by mouth three (3) times daily. Indications: major depressive disorder     No current facility-administered medications for this visit.      There are no discontinued medications.    ALLERGIES   No Known Allergies    REVIEW OF SYSTEMS   See HPI    ACTIVE MEDICAL PROBLEMS     Patient Active Problem List   Diagnosis Code   ??? Moderate episode of recurrent major depressive disorder (HCC) F33.1   ??? Prediabetes R73.03   ??? 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 Nocturia R35.1   ??? Anxiety F41.9       SOCIAL HISTORY     Social History     Social History Narrative    Marital status: Married, [name] - 9 years    Living situation: Own home in [place]     Pets in the home: 2 dogs, 1 cat     Children: None- 2 step children     Occupation: Disabled    Highest education: HS    Alcohol,tobacco, drug ZOX:WRUEAV and marijuana use     Caffeine/ diet: Keto,  Decaf coffee     Exercise: None     Military- None                VITALS   There were no vitals filed for this visit.  There is no height or weight on file to calculate BMI.    BP Readings from Last 3 Encounters:   03/22/18 110/84     Wt Readings from Last 3  Encounters:   03/22/18 188 lb 12.8 oz (85.6 kg)       PHYSICAL EXAM     Audio only encounter with no pertinent exam findings    ORDERS & MEDS (NEW)     Orders Placed This Encounter   ??? PSA SCREENING (SCREENING)     Standing Status:   Future     Standing Expiration Date:   11/08/2019   ??? hydrOXYzine pamoate (VISTARIL) 25 mg capsule     Sig: Take 1-2 Caps by mouth nightly as needed for Anxiety or Sleep.     Dispense:  60 Cap     Refill:  3       ASSESSMENT AND PLAN     Diagnoses and all orders for this visit:    1. Moderate episode of recurrent major depressive disorder [facility](HCC)  Assessment & Plan:  The addition of Wellbutrin has been beneficial. He and his wife have both noticed improved mood and decreased irritability. His neuropsychiatric consult has not been scheduled- but they have been contacted. No changes. Continue current regimen and follow-up in one month.       2. Anxiety  Assessment & Plan:  Stephen BoomDaniel reports anxiety in the evenings that is interfering with sleep. Start hydroxyzine 25-50mg  QHS. Follow-up one month.     Orders:  -     hydrOXYzine pamoate (VISTARIL) 25 mg capsule; Take 1-2 Caps by mouth nightly as needed for Anxiety or Sleep.    3. Nocturia  Assessment & Plan:  Patient reports nocturia 3-4 times per night with associated urinary hesitancy and weak stream. He reports urinary frequency during the daytime as well.  He denies dysuria, hematuria, flank pain, or incomplete bladder emptying. I do not suspect UTI. BPH is more likely. He has a strong family history of diabetes and this needs to be ruled out as well. This has been a chronic issue for several years.  We will plan to check an A1C and PSA. Follow-up in 1 month.    Orders:   -     PSA SCREENING (SCREENING); Future    4. Encounter for screening for malignant neoplasm of prostate   -     PSA SCREENING (SCREENING); Future    Follow-up and Dispositions    ?? Return in about 1 month (around 06/08/2018) for anxiety, depression, nocturia.         No future appointments.    Jamison Neighborobin A Jourdyn Ferrin, FNP  05/08/2018

## 2018-05-08 NOTE — Assessment & Plan Note (Signed)
Patient reports nocturia 3-4 times per night with associated urinary hesitancy and weak stream. He reports urinary frequency during the daytime as well.  He denies dysuria, hematuria, flank pain, or incomplete bladder emptying. I do not suspect UTI. BPH is more likely. He has a strong family history of diabetes and this needs to be ruled out as well. This has been a chronic issue for several years.  We will plan to check an A1C and PSA. Follow-up in 1 month.

## 2018-05-11 ENCOUNTER — Telehealth

## 2018-05-11 NOTE — Telephone Encounter (Signed)
Pt name and DOB verified. Pt is calling states that he was placed on a medication for sleep and states that the next day he is dizzy and does not feel very good.  Cb# [phone] Darcie Farrel Demark

## 2018-05-11 NOTE — Telephone Encounter (Signed)
Called Patient for more information, unable to leave message patients Voicemail is full.

## 2018-05-12 MED ORDER — DOXEPIN 10 MG CAP
10 mg | ORAL_CAPSULE | Freq: Every evening | ORAL | 0 refills | Status: DC
Start: 2018-05-12 — End: 2018-12-26

## 2018-05-12 NOTE — Telephone Encounter (Signed)
Spoke with wife about pt's new medication, pt feels like a zombie and thinks the dose of the medication could be too much. He did not take it last night and woke up not feeling the way he did when he took it. I let her know that you wouldn't be in until Monday and was okay to wait as this is a PRN medication and did not take it last night.

## 2018-05-12 NOTE — Telephone Encounter (Signed)
I have removed hydroxyzine from his medication list as it was too sedating. Would he like to try an alternative? If so please send prescription for doxepin 10mg  QHS.

## 2018-05-12 NOTE — Telephone Encounter (Signed)
Spoke to patient's wife and asked if [place] would like an alternative medication. He is agreeable to try. Script sent to pharmacy.

## 2018-05-12 NOTE — Telephone Encounter (Addendum)
Pt name and DOB verified.    Pt's wife [name] and pt calling Melinda back.       [phone]  Stephen Meadows

## 2018-06-12 ENCOUNTER — Ambulatory Visit: Payer: MEDICARE | Attending: Family | Primary: Family

## 2018-06-12 NOTE — Progress Notes (Deleted)
Stephen Meadows   [address]  [place] [address]  [phone]      TELEMEDICINE VISIT (Performed via Telephone or Audio & Video)     DEVICE PREFERENCE:  Telephone  INVITE METHOD:    Provider to call Mobile Phone:   Telephone Information:   Mobile [phone]     METHOD OF VISIT:  Audio only (Telephone) - patient cannot do a video visit due to (lack of equipment, data or connectivity)  PROVIDER LOCATION:  Home  PATIENT Stephen Meadows (PHYSICAL ADDRESS):  TOWN:  [place]  STATE:  [place]  PARTICIPANT(S):  Patient and Family member: wife- Stephen Meadows  (name)    BILLING CODE:  Telephone Encounter:  [phone]: 21-30 Minutes       (Commercial, Medicaid, or Medicare) Telephone evaluation and management service provided by a physician or other qualified health care professional who may report evaluation and management services provided to an established patient, parent, or guardian not originating from a related E/M service provided within the previous 7 days nor leading to an E/M service or procedure within the next 24 hours or soonest available appointment.  TOTAL TIME SPENT WITH ENCOUNTER:  25 minutes    ? Review of electronic health record:  o I specifically reviewed: Previous Office Encounters  ? Problems, Meds and Allergies were reviewed.  ? Patient gave verbal consent for this TeleHealth visit.    CHIEF COMPLAINT   Stephen Meadows is a 58 y.o. male who presents for a Tele-Health visit Meadows for No chief complaint on file.Marland Kitchen    HISTORY OF PRESENT ILLNESS     I had the pleasure of speaking with Stephen Meadows Meadows. He is a pleasant 58 year old caucasian male with a history of depression and complex regional pain syndrome. He presents for evaluation of depression. Due to the Stephen Meadows. His wife joins [name] on the call.     He says he's been doing pretty good. He's been getting outside a lot and  feels the Wellbutrin is working. He's not as angry or irritable as he had been and his wife says she notices a dramatic improvement as well. He wonders if he needs another medicine for anxiety. He sometimes feels irritated in the evenings and has been having a hard time sleeping because of it. He has a very hard time explaining what he means by this, but thinks he is feeling anxious. "Maybe I'm worried about the virus- or my Daddy." His father is 46 and he worries about his health. It is hard not being able to see him. He lives in [place] and they haven't seen each other in two years.    Nocturia:  Stephen Meadows says he's been getting up about 3-4 times a night to urinate for the past few years and it seems to be getting wore. He says he hasn't had a good night's sleep in two years. He urinates frequently during the daytime as well and says if he hears the faucet running he needs to go right away. He deneies dysuria or incontinence. He reports urinary hesitancy and a weak stream. "Sometimes it feels like a drizzle." Both of his parents had type II diabetes. He has a reported history of prediabetes but has not had A1C testing in awhile. There is no baseline on file and he did not go for his labs.        MEDICATIONS  Current Outpatient Medications   Medication Sig   ??? doxepin (SINEquan) 10 mg capsule Take 1 Cap by mouth nightly.   ??? ibuprofen (MOTRIN) 200 mg tablet Take 400 mg by mouth every eight (8) hours as needed (foot pain).   ??? buPROPion XL (WELLBUTRIN XL) 150 mg tablet Take 1 Tab by mouth every morning. Indications: major depressive disorder   ??? venlafaxine (EFFEXOR) 37.5 mg tablet Take 1 Tab by mouth three (3) times daily. Indications: major depressive disorder     No current facility-administered medications for this visit.      There are no discontinued medications.    ALLERGIES   No Known Allergies    REVIEW OF SYSTEMS   See HPI    ACTIVE MEDICAL PROBLEMS     Patient Active Problem List    Diagnosis Code   ??? Moderate episode of recurrent major depressive disorder (HCC) F33.1   ??? Prediabetes R73.03   ??? 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 Nocturia R35.1   ??? Anxiety F41.9       SOCIAL HISTORY     Social History     Social History Narrative    Marital status: Married, Stephen Meadows - 9 years    Living situation: Own home in [place]     Pets in the home: 2 dogs, 1 cat     Children: None- 2 step children     Occupation: Disabled    Highest education: HS    Alcohol,tobacco, drug RJJ:OACZYSuse:smoker and marijuana use     Caffeine/ diet: Keto,  Decaf coffee    Exercise: None     Military- None                VITALS   There were no vitals filed for this visit.  There is no height or weight on file to calculate BMI.    BP Readings from Last 3 Encounters:   03/22/18 110/84     Wt Readings from Last 3 Encounters:   03/22/18 188 lb 12.8 oz (85.6 kg)       PHYSICAL EXAM     Audio only encounter with no pertinent exam findings    ORDERS & MEDS (NEW)     No orders of the defined types were placed in this encounter.      ASSESSMENT AND PLAN     Diagnoses and all orders for this visit:    1. Moderate episode of recurrent major depressive disorder (HCC)    2. Anxiety    3. Nocturia          Future Appointments   Date Time Provider Department Center   06/12/2018  9:00 AM Meadows, Stephen Neighborobin A, FNP [facility]       Stephen Neighborobin A Dobrinick, FNP  06/12/2018

## 2018-06-21 ENCOUNTER — Telehealth: Admit: 2018-06-21 | Discharge: 2018-06-22 | Payer: MEDICARE | Attending: Family | Primary: Family

## 2018-06-21 ENCOUNTER — Telehealth: Attending: Family

## 2018-06-21 DIAGNOSIS — F331 Major depressive disorder, recurrent, moderate: Secondary | ICD-10-CM

## 2018-06-21 MED ORDER — TRIAMCINOLONE ACETONIDE 0.1 % TOPICAL CREAM
0.1 % | Freq: Two times a day (BID) | CUTANEOUS | 0 refills | Status: DC
Start: 2018-06-21 — End: 2018-12-26

## 2018-06-21 NOTE — Progress Notes (Signed)
[facility] AT [address]   [address]  [place] [id number]  [phone]      TELEMEDICINE VISIT (Performed via Telephone or Audio & Video)     DEVICE PREFERENCE:  Telephone  INVITE METHOD:    Provider to call Mobile Phone:   Telephone Information:   Mobile [phone]     METHOD OF VISIT:  Audio only (Telephone) - patient cannot do a video visit due to (lack of equipment, data or connectivity)  PROVIDER LOCATION:  Home  PATIENT LOCATION AT TIME OF VISIT (PHYSICAL ADDRESS):  TOWN:  [place]  STATE:  [place]  PARTICIPANT(S):  Patient and Family member: wife- Anastasia FiedlerLaura Lee  (name)    BILLING CODE:  Telephone Encounter:  [phone]: 21-30 Minutes       (Commercial, Medicaid, or Medicare) Telephone evaluation and management service provided by a physician or other qualified health care professional who may report evaluation and management services provided to an established patient, parent, or guardian not originating from a related E/M service provided within the previous 7 days nor leading to an E/M service or procedure within the next 24 hours or soonest available appointment.  TOTAL TIME SPENT WITH ENCOUNTER:  25 minutes    ??? Review of electronic health record:  o I specifically reviewed: Previous Office Encounters  ??? Problems, Meds and Allergies were reviewed.  ??? Patient gave verbal consent for this TeleHealth visit.    CHIEF COMPLAINT   Stephen Meadows is a 58 y.o. male who presents for a Tele-Health visit today for Anxiety; Depression; and Nocturia.    HISTORY OF PRESENT ILLNESS     I had the pleasure of speaking with Stephen Meadows today. He is a pleasant 58 year old caucasian male with a history of depression and complex regional pain syndrome. He presents for evaluation of depression. Due to the COVID19 pandemic we are conducting this visit via telephone today. His wife joins us on the call.     Depression/Anxiety:  Stephen BoomDaniel says he's had a couple of "blow-ups" since we last talked. They  had company and he wasn't used to the noise at night. He blew up and said things that he didn't mean to. His wife says she hasn't noted a significant change in his mood and still finds him overall improved since starting wellbutrin. He found hydroxyzine to be far too sedating. He just started taking the doxepin, but as he's only tried it once it is too soon to say if it helped. He did fall asleep better and felt his anxiety was a little bit less when he took it. He has an appointment for a neuropsych evaluation with Dr. Manfred ArchBeata Wiktor on December 10th.    Nocturia:  When I last spoke with Stephen Boomaniel he reported nocturia x3-4 for the past few years and daytime frequency and urgency as well. He denies dysuria or incontinence but admitted to urinary hesitancy and a weak stream. "Sometimes it feels like a drizzle." Both of his parents had type II diabetes. He has a reported history of prediabetes but has not had A1C testing in awhile. There is no baseline on file and he did not go for his labs. His symptoms are unchanged.     His wife is concerned about some skin lesions. She says he has psoriasis on the back of his knees. He had it once before about two years ago and it hasn't gone away since. She has psoriasis so she knows what it looks like.  He also has a spot on his head that she is concerned about. It's like a pimple, but it's hard. Sometimes if he squeezes it "what looks like noodles" comes out. His mother had melanoma so she is worried.     MEDICATIONS     Current Outpatient Medications   Medication Sig   ??? triamcinolone acetonide (KENALOG) 0.1 % topical cream Apply  to affected area two (2) times a day. use thin layer   ??? doxepin (SINEquan) 10 mg capsule Take 1 Cap by mouth nightly.   ??? ibuprofen (MOTRIN) 200 mg tablet Take 400 mg by mouth every eight (8) hours as needed (foot pain).   ??? buPROPion XL (WELLBUTRIN XL) 150 mg tablet Take 1 Tab by mouth every morning. Indications: major depressive disorder   ??? venlafaxine  (EFFEXOR) 37.5 mg tablet Take 1 Tab by mouth three (3) times daily. Indications: major depressive disorder     No current facility-administered medications for this visit.      There are no discontinued medications.    ALLERGIES   No Known Allergies    REVIEW OF SYSTEMS   See HPI    ACTIVE MEDICAL PROBLEMS     Patient Active Problem List   Diagnosis Code   ??? Moderate episode of recurrent major depressive disorder (HCC) F33.1   ??? Prediabetes R73.03   ??? 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 Nocturia R35.1   ??? Anxiety F41.9   ??? Scalp lesion L98.9   ??? Psoriasis-like skin disease L98.9       SOCIAL HISTORY     Social History     Social History Narrative    Marital status: Married, Vernona RiegerLaura - 9 years    Living situation: Own home [place]     Pets in the home: 2 dogs, 1 cat     Children: None- 2 step children     Occupation: Disabled    Highest education: HS    Alcohol,tobacco, drug ZOX:WRUEAVuse:smoker and marijuana use     Caffeine/ diet: Keto,  Decaf coffee    Exercise: None     Military- None                VITALS   There were no vitals filed for this visit.  There is no height or weight on file to calculate BMI.    BP Readings from Last 3 Encounters:   03/22/18 110/84     Wt Readings from Last 3 Encounters:   03/22/18 188 lb 12.8 oz (85.6 kg)       PHYSICAL EXAM     Audio only encounter with no pertinent exam findings    ORDERS & MEDS (NEW)     Orders Placed This Encounter   ??? [facility]     Referral Priority:   Routine     Referral Type:   Consultation     Referral Reason:   Specialty Services Required     [facility]     Requested Specialty:   Dermatology     Number of Visits Requested:   1   ??? Referral to [facility]     Referral Priority:   Routine     Referral Type:   Behavioral Health     Referral Reason:   Specialty Services Required     Requested Specialty:   Psychiatry     Number of Visits Requested:   1   ???  triamcinolone acetonide (KENALOG)  0.1 % topical cream     Sig: Apply  to affected area two (2) times a day. use thin layer     Dispense:  15 g     Refill:  0       ASSESSMENT AND PLAN     Diagnoses and all orders for this visit:    1. Moderate episode of recurrent major depressive disorder [facility])  Assessment & Plan:  Mood is unchanged overall- but both agree he is better since starting wellbutrin. Revis is a very poor historian. He relies on his wife to speak for him and was quick to get off of the phone. His wife suspects he may have autism spectrum disorder. He has been referred for a neuropsychiatric evaluation and is scheduled on 12/14/2018. I have referred him to [place] psychiatry for ongoing evaluation and medication management.     Orders:  -     triamcinolone acetonide (KENALOG) 0.1 % topical cream; Apply  to affected area two (2) times a day. use thin layer  -     REFERRAL TO PSYCHIATRY    2. Nocturia  Assessment & Plan:  Patient reports nocturia 3-4 times per night with associated urinary hesitancy and weak stream. He reports urinary frequency during the daytime as well.  He denies dysuria, hematuria, flank pain, or incomplete bladder emptying. I do not suspect UTI. BPH is more likely. He has a strong family history of diabetes and this needs to be ruled out as well. This has been a chronic issue for several years. Las ordered to check an A1C and PSA. He agreed to have these drawn prior to his annual in three months.       3. Anxiety  Assessment & Plan:  Hydroxyzine caused morning grogginess so I started him on doxepin at bedtime. He has only taken it once but thinks it helped a little. It's too early to assess. He will continue with it and call if he has any side effects. I have referred him to psychiatry for ongoing evaluation and medication management.     Orders:  -     REFERRAL TO PSYCHIATRY    4. Scalp lesion  Assessment & Plan:  Vinicius's wife describes what sounds like a sebaceous cyst. She is concerned for melanoma based on his family  history. I reassured her that what she describes warrants evaluation, but does not sound like a malignancy. She would like him to have it biopsied. I have referred her to [facility] for further evaluation.     Orders:  -     REFERRAL TO DERMATOLOGY    5. Psoriasis-like skin disease  Assessment & Plan:  Estephan's wife says he has an itchy, scaly rash on the backs of his knees. She thinks it is psoriasis based on her own history of the same. Eczema is also a possibility. Either way, we'll treat with a topical steroid. Follow-up as needed if symptoms fail to resolve.     Orders:  -     REFERRAL TO DERMATOLOGY    Follow-up and Dispositions    ?? Return in about 3 months (around 09/21/2018) for AWV, *fasting lab appointment 1 week prior to annual.         Future Appointments   Date Time Provider [facility]   12/26/2018 10:00 AM [facility]   01/02/2019  1:00 PM Jedd Schulenburg, Janace Hoard, FNP [facility]       Shirlean Mylar  A Crescentia Boutwell, FNP  06/21/2018

## 2018-06-21 NOTE — Progress Notes (Signed)
[facility] AT Oletta D[facility]   [address]  [place] [address]  [phone]      TELEMEDICINE VISIT (Performed via Telephone or Audio & Video)     DEVICE PREFERENCE:  Telephone  INVITE METHOD:    Provider to call Mobile Phone:   Telephone Information:   Mobile [phone]     METHOD OF VISIT:  Audio only (Telephone) - patient cannot do a video visit due to (lack of equipment, data or connectivity)  PROVIDER LOCATION:  Home  PATIENT [name] (PHYSICAL ADDRESS):  TOWN:  [place]  STATE:  [place]  PARTICIPANT(S):  Patient and Family member: wife- [name]  (name)    BILLING CODE:  Telephone Encounter:  [phone]: 21-30 Minutes       (Commercial, Medicaid, or Medicare) Telephone evaluation and management service provided by a physician or other qualified health care professional who may report evaluation and management services provided to an established patient, parent, or guardian not originating from a related E/M service provided within the previous 7 days nor leading to an E/M service or procedure within the next 24 hours or soonest available appointment.  TOTAL TIME SPENT WITH ENCOUNTER:  25 minutes    ? Review of electronic health record:  o I specifically reviewed: Previous Office Encounters  ? Problems, Meds and Allergies were reviewed.  ? Patient gave verbal consent for this TeleHealth visit.    CHIEF COMPLAINT   Stephen Meadows is a 58 y.o. male who presents for a Tele-Health visit today for Anxiety; Depression; and Nocturia.    HISTORY OF PRESENT ILLNESS     I had the pleasure of speaking with Stephen Meadows today. He is a pleasant 59 year old caucasian male with a history of depression and complex regional pain syndrome. He presents for evaluation of depression. Due to the [place] pandemic we are conducting this visit via telephone today. His wife joins [name] on the call.     Depression/Anxiety:   Jeremy says he's had a couple of "blow-ups" since we last talked. They had company and he wasn't used to the noise at night. He blew up and said things that he didn't mean to. His wife says she hasn't noted a significant change in his mood and still finds him overall improved since starting wellbutrin. He found hydroxyzine to be far too sedating. He just started taking the doxepin, but as he's only tried it once it is too soon to say if it helped. He did fall asleep better and felt his anxiety was a little bit less when he took it. He has an appointment for a neuropsych evaluation with Dr. Cornelia Copa on December 10th.    Nocturia:  When I last spoke with Stephen Meadows he reported nocturia x3-4 for the past few years and daytime frequency and urgency as well. He denies dysuria or incontinence but admitted to urinary hesitancy and a weak stream. "Sometimes it feels like a drizzle." Both of his parents had type II diabetes. He has a reported history of prediabetes but has not had A1C testing in awhile. There is no baseline on file and he did not go for his labs. His symptoms are unchanged.     His wife is concerned about some skin lesions. She says he has psoriasis on the back of his knees. He had it once before about two years ago and it hasn't gone away since. She has psoriasis so she knows what it looks like.  He also has a spot on his head that she is concerned about. It's like a pimple, but it's hard. Sometimes if he squeezes it "what looks like noodles" comes out. His mother had melanoma so she is worried.     MEDICATIONS     Current Outpatient Medications   Medication Sig   ??? triamcinolone acetonide (KENALOG) 0.1 % topical cream Apply  to affected area two (2) times a day. use thin layer   ??? doxepin (SINEquan) 10 mg capsule Take 1 Cap by mouth nightly.   ??? ibuprofen (MOTRIN) 200 mg tablet Take 400 mg by mouth every eight (8) hours as needed (foot pain).    ??? buPROPion XL (WELLBUTRIN XL) 150 mg tablet Take 1 Tab by mouth every morning. Indications: major depressive disorder   ??? venlafaxine (EFFEXOR) 37.5 mg tablet Take 1 Tab by mouth three (3) times daily. Indications: major depressive disorder     No current facility-administered medications for this visit.      There are no discontinued medications.    ALLERGIES   No Known Allergies    REVIEW OF SYSTEMS   See HPI    ACTIVE MEDICAL PROBLEMS     Patient Active Problem List   Diagnosis Code   ??? Moderate episode of recurrent major depressive disorder (HCC) F33.1   ??? Prediabetes R73.03   ??? 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 Nocturia R35.1   ??? Anxiety F41.9   ??? Scalp lesion L98.9   ??? Psoriasis-like skin disease L98.9       SOCIAL HISTORY     Social History     Social History Narrative    Marital status: Married, Stephen Meadows - 9 years    Living situation: [place]     Pets in the home: 2 dogs, 1 cat     Children: None- 2 step children     Occupation: Disabled    Highest education: HS    Alcohol,tobacco, drug ZOX:WRUEAVuse:smoker and marijuana use     Caffeine/ diet: Keto,  Decaf coffee    Exercise: None     Military- None                VITALS   There were no vitals filed for this visit.  There is no height or weight on file to calculate BMI.    BP Readings from Last 3 Encounters:   03/22/18 110/84     Wt Readings from Last 3 Encounters:   03/22/18 188 lb 12.8 oz (85.6 kg)       PHYSICAL EXAM     Audio only encounter with no pertinent exam findings    ORDERS & MEDS (NEW)     Orders Placed This Encounter   ??? [facility]     Referral Priority:   Routine     Referral Type:   Consultation     Referral Reason:   Specialty Services Required     [facility]     Requested Specialty:   Dermatology     Number of Visits Requested:   1   ??? Referral to [facility]     Referral Priority:   Routine     Referral Type:   Behavioral Health      Referral Reason:   Specialty Services Required     Requested Specialty:   Psychiatry     Number of Visits Requested:   1   ???  triamcinolone acetonide (KENALOG) 0.1 % topical cream     Sig: Apply  to affected area two (2) times a day. use thin layer     Dispense:  15 g     Refill:  0       ASSESSMENT AND PLAN     Diagnoses and all orders for this visit:    1. Moderate episode of recurrent major depressive disorder [facility](HCC)  Assessment & Plan:  Mood is unchanged overall- but both agree he is better since starting wellbutrin. Reuel BoomDaniel is a very poor historian. He relies on his wife to speak for him and was quick to get off of the phone. His wife suspects he may have autism spectrum disorder. He has been referred for a neuropsychiatric evaluation and is scheduled on 12/14/2018. I have referred him to [facility] for ongoing evaluation and medication management.     Orders:  -     triamcinolone acetonide (KENALOG) 0.1 % topical cream; Apply  to affected area two (2) times a day. use thin layer  -     REFERRAL TO PSYCHIATRY    2. Nocturia  Assessment & Plan:  Patient reports nocturia 3-4 times per night with associated urinary hesitancy and weak stream. He reports urinary frequency during the daytime as well.  He denies dysuria, hematuria, flank pain, or incomplete bladder emptying. I do not suspect UTI. BPH is more likely. He has a strong family history of diabetes and this needs to be ruled out as well. This has been a chronic issue for several years. Las ordered to check an A1C and PSA. He agreed to have these drawn prior to his annual in three months.       3. Anxiety  Assessment & Plan:  Hydroxyzine caused morning grogginess so I started him on doxepin at bedtime. He has only taken it once but thinks it helped a little. It's too early to assess. He will continue with it and call if he has any side effects. I have referred him to psychiatry for ongoing evaluation and medication management.     Orders:   -     REFERRAL TO PSYCHIATRY    4. Scalp lesion  Assessment & Plan:  Hulbert's wife describes what sounds like a sebaceous cyst. She is concerned for melanoma based on his family history. I reassured her that what she describes warrants evaluation, but does not sound like a malignancy. She would like him to have it biopsied. I [facility] for further evaluation.     Orders:  -     REFERRAL TO DERMATOLOGY    5. Psoriasis-like skin disease  Assessment & Plan:  Jacquez's wife says he has an itchy, scaly rash on the backs of his knees. She thinks it is psoriasis based on her own history of the same. Eczema is also a possibility. Either way, we'll treat with a topical steroid. Follow-up as needed if symptoms fail to resolve.     Orders:  -     REFERRAL TO DERMATOLOGY    Follow-up and Dispositions    ?? Return in about 3 months (around 09/21/2018) for AWV, *fasting lab appointment 1 week prior to annual.         Future Appointments   Date Time Provider Department Center   12/26/2018 10:00 AM [facility]   01/02/2019  1:00 PM Dylann Layne, Jamison Neighborobin A, FNP [facility]       Ebubechukwu Jedlicka A  Demorio Seeley, FNP  06/21/2018

## 2018-06-25 NOTE — Assessment & Plan Note (Signed)
Patient reports nocturia 3-4 times per night with associated urinary hesitancy and weak stream. He reports urinary frequency during the daytime as well.  He denies dysuria, hematuria, flank pain, or incomplete bladder emptying. I do not suspect UTI. BPH is more likely. He has a strong family history of diabetes and this needs to be ruled out as well. This has been a chronic issue for several years. Las ordered to check an A1C and PSA. He agreed to have these drawn prior to his annual in three months.

## 2018-06-25 NOTE — Assessment & Plan Note (Signed)
Hydroxyzine caused morning grogginess so I started him on doxepin at bedtime. He has only taken it once but thinks it helped a little. It's too early to assess. He will continue with it and call if he has any side effects. I have referred him to psychiatry for ongoing evaluation and medication management.

## 2018-06-25 NOTE — Assessment & Plan Note (Signed)
Stephen Meadows's wife describes what sounds like a sebaceous cyst. She is concerned for melanoma based on his family history. I reassured her that what she describes warrants evaluation, but does not sound like a malignancy. She would like him to have it biopsied. I have referred her to [facility] for further evaluation.

## 2018-06-25 NOTE — Assessment & Plan Note (Signed)
Stephen Meadows's wife says he has an itchy, scaly rash on the backs of his knees. She thinks it is psoriasis based on her own history of the same. Eczema is also a possibility. Either way, we'll treat with a topical steroid. Follow-up as needed if symptoms fail to resolve.

## 2018-06-25 NOTE — Assessment & Plan Note (Signed)
Mood is unchanged overall- but both agree he is better since starting wellbutrin. Stephen Meadows is a very poor historian. He relies on his wife to speak for him and was quick to get off of the phone. His wife suspects he may have autism spectrum disorder. He has been referred for a neuropsychiatric evaluation and is scheduled on 12/14/2018. I have referred him to [facility] for ongoing evaluation and medication management.

## 2018-06-25 NOTE — Assessment & Plan Note (Signed)
Patient reports nocturia 3-4 times per night with associated urinary hesitancy and weak stream. He reports urinary frequency during the daytime as well.  He denies dysuria, hematuria, flank pain, or incomplete bladder emptying. I do not suspect UTI. BPH is more likely. He has a strong family history of diabetes and this needs to be ruled out as well. This has been a chronic issue for several years. Las ordered to check an A1C and PSA. He agreed to have these drawn prior to his annual in three months.

## 2018-06-25 NOTE — Assessment & Plan Note (Signed)
Mood is unchanged overall- but both agree he is better since starting wellbutrin. Stephen Meadows is a very poor historian. He relies on his wife to speak for him and was quick to get off of the phone. His wife suspects he may have autism spectrum disorder. He has been referred for a neuropsychiatric evaluation and is scheduled on 12/14/2018. I have referred him to [facility] for ongoing evaluation and medication management.

## 2018-06-25 NOTE — Assessment & Plan Note (Signed)
Hydroxyzine caused morning grogginess so I started him on doxepin at bedtime. He has only taken it once but thinks it helped a little. It's too early to assess. He will continue with it and call if he has any side effects. I have referred him to psychiatry for ongoing evaluation and medication management.

## 2018-06-25 NOTE — Assessment & Plan Note (Signed)
Kaj's wife describes what sounds like a sebaceous cyst. She is concerned for melanoma based on his family history. I reassured her that what she describes warrants evaluation, but does not sound like a malignancy. She would like him to have it biopsied. I have referred her to [facility] for further evaluation.

## 2018-06-25 NOTE — Assessment & Plan Note (Signed)
Stephen Meadows's wife says he has an itchy, scaly rash on the backs of his knees. She thinks it is psoriasis based on her own history of the same. Eczema is also a possibility. Either way, we'll treat with a topical steroid. Follow-up as needed if symptoms fail to resolve.

## 2018-07-04 ENCOUNTER — Telehealth: Admit: 2018-07-04 | Payer: MEDICARE | Attending: Psychiatry | Primary: Family

## 2018-07-04 ENCOUNTER — Telehealth: Attending: Psychiatry

## 2018-07-04 DIAGNOSIS — F331 Major depressive disorder, recurrent, moderate: Secondary | ICD-10-CM

## 2018-07-04 MED ORDER — GABAPENTIN 100 MG CAP
100 mg | ORAL_CAPSULE | ORAL | 1 refills | Status: DC
Start: 2018-07-04 — End: 2019-01-02
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
## 2018-10-06 ENCOUNTER — Telehealth

## 2018-10-06 NOTE — Telephone Encounter (Signed)
PRESCRIPTION REFILL REQUEST    TO PROVIDER:  Refills on file see below note       Last apt was a virtual visit 06-21-2018 F/U Plan Below, from this note I do not note the patient to be homeless. Would you like to refer to Kathlee Nations?    Patient has Medicare & [facility]        Instructions        Return in about 3 months (around 09/21/2018) for AWV, *fasting lab appointment 1 week prior to annual.             Medication(s) Requested:    Requested Prescriptions     Pending Prescriptions Disp Refills   ??? venlafaxine (EFFEXOR) 37.5 mg tablet 270 Tab 3     Sig: Take 1 Tab by mouth three (3) times daily. Indications: major depressive disorder        Last RF Date:  03-22-2018  Last RF Quantity:  270 * 3          Preferred Pharmacy:   [facility] [address], [place]  [place]  [place] [address]  Phone: [phone] Fax: [phone]      Last appt @ PCP Office: 06/21/2018   Future Appointments   Date Time Provider [place]   01/02/2019  1:00 PM Dobrinick, Janace Hoard, FNP [facility]       MOST RECENT BLOOD PRESSURES  BP Readings from Last 3 Encounters:   03/22/18 110/84       MOST RECENT LAB DATA  No results found for: CREA, CREATEXT, K, POTEXT, ALT, TSH, TSHP, TSHEXT, CHOL, HGB, HGBEXT, HCT, HCTEXT, B12LT, B12EXT, URICACIDEXT, HBA1C, HGBE8, HBA1CPOC, HBA1CEXT, INR, TST5, TESFTT, TSHEXT, HGBEXT, HCTEXT, HBA1CEXT, INREXT

## 2018-10-06 NOTE — Telephone Encounter (Signed)
WINDLE HUEBERT  24-Jul-1960  Pt is homeless and has been living in the [place] [place] parking lot in [place] for 2 months. Pt is requesting assistance to help pay for this Rx. Pt has not been taking this medication like he should. Pt has been taking only 1 tablet a day to get by. Please advise and call pt.     Call back needed: yes    Preferred call back number: Call preference: Home phone      [phone] (home)    Telephone Information:   Mobile [phone]        Medications Requested:  Requested Prescriptions     Pending Prescriptions Disp Refills   ??? venlafaxine (EFFEXOR) 37.5 mg tablet 270 Tab 3     Sig: Take 1 Tab by mouth three (3) times daily. Indications: major depressive disorder             Preferred Pharmacy:   [facility] [address] [place]  [place]  [place] [address]  Phone: [phone] Fax: [phone]                                       Last visit with provider:  06/21/2018    Future Appointments   Date Time Provider [place]   01/02/2019  1:00 PM Dobrinick, Janace Hoard, FNP [facility]       MOST RECENT BLOOD PRESSURES  BP Readings from Last 3 Encounters:   03/22/18 110/84        MOST RECENT LAB DATA  No results found for: CREA, CREATEXT, K, POTEXT, ALT, TSH, TSHP, TSHEXT, CHOL, HGB, HGBEXT, HCT, HCTEXT, B12LT, B12EXT, URICACIDEXT, HBA1C, HGBE8, HBA1CPOC, HBA1CEXT, INR, TST5, TESFTT, TSHEXT, HGBEXT, HCTEXT, HBA1CEXT, INREXT     [place]

## 2018-10-08 NOTE — Telephone Encounter (Addendum)
Please ask him about his copay. This should be very inexpensive with insurance. We can't get samples for generics. He does not qualify for complex care management or social work services through this office. Please see response from Katheren Shams below. There is a lot to discuss here.  Please schedule him for an office visit asap.   Stephen Meadows  Haskell Rihn, Jamison Neighbor, FNP; [facility] [place] Nurse B    Caller: Unspecified (3 days ago, ??2:50 PM)       ??       Unfortunately patient does not meet criteria for Complex Care Management.   It does appear he has insurance coverage and if his insurance covers some medications he will likely not be eligible for patient assistance. I would recommend he contact [employer] [facility] to meet with a Medicare Specialist as it is almost open enrollment time and he can see if there are other plans that might provide better coverage for prescriptions.     ??If he has SS he will be over income for [employer]. He could check with the town he resides in just to make sure bit I know in [place] the income limits are very low and most towns follow similar guidelines.     He could also contact [facility] if he feels he needs a CM as they have grant funds available or [employer]   [facility]- [phone]   [employer]- [phone]   [facility]- [phone]     I hope these resources help   Marisue Ivan

## 2018-10-10 NOTE — Telephone Encounter (Signed)
Please ask him about his copay for Effexor. He requested assistance by we can't get samples of generic medicaitons. This should be very inexpensive with insurance. He does not qualify for complex care management or social work services through this office. Please schedule him for an office visit asap.

## 2018-10-13 NOTE — Telephone Encounter (Signed)
I called the pt and left him a VM message to call us back.  Please transfer his call. Thanks.

## 2018-10-17 NOTE — Telephone Encounter (Signed)
Called and spoke with Stephen Meadows, he states that his copay increased from $3 to $40/month for his Effexor. It appears that he is now in the donut hole. He states that he was planning on getting his medication this week because he will be getting paid. Although we did discuss that this cost is not sustainable for him. Reviewed [employer] and [employer] $4 lists, only brought cost down to $38. We also reviewed GoodRx coupons. [employer] and [employer] have coupons for $17. He would like to stay at [facility] because he is currently living in the parking lot. Confirmed that we could get #90 tabs at [facility] through [facility] for $21. Stephen Meadows states that this will be manageable. Reviewed how to get the reduced price using GoodRx. He expressed understanding. He is aware to contact the office if there are any problems with getting prescription or if this is no longer manageable in the future. He is agreeable to plan.

## 2018-10-17 NOTE — Telephone Encounter (Signed)
Noted and appreciated.

## 2018-10-27 NOTE — Telephone Encounter (Signed)
Patient verified by name and DOB.    Patient is currently in [place] area and is out of his Effexor. He has no way to get back to [place] and get that prescription filled. Recommended he call the [facility] in [place] [phone] and ask them ti tranfers to the [facility] in [place]. Per patient it is located at:    Located in: [facility]  Address: [address], [place], [place] [address]  Departments: [facility]  Hours:   Open ? Closes 10PM   ?? More hours  Phone: [phone]     Told him to please call us back if that does not work.  Normajean Glasgow, MSN, RN, 10/27/2018 at 11:39 AM

## 2018-10-27 NOTE — Telephone Encounter (Signed)
Pt name and DOB verified.  Pt is calling in. The caller is muffled. The caller is not answering direct questions  Pt is stating that he cannot afford his  venlafaxine (EFFEXOR) 37.5 mg tablet   [phone] (home)   Everlean Alstrom

## 2018-11-02 ENCOUNTER — Telehealth
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
## 2018-11-02 NOTE — Telephone Encounter (Signed)
I called the pt to make him aware and he was agreeable to this referral.  He said that his biggest concern right now is getting power and he has been staying in a camper in a parking lot and has no power.  He is concerned about the weather forecast.  He has a friend that has a vacant house next door that he may be able to park his camper at.  I gave the pt the 211 info and told him to go to the BED if he ever felt like he was in crisis and he voiced good understanding.  I will call him early next week for an update.

## 2018-11-02 NOTE — Telephone Encounter (Signed)
Agree with plans below.   I signed referral.  Please be sure he knows when/how to access the 211 hotline in [place].   And if he is in crisis, recommend he return to the ED.   If there is no other update in the interim, reach out to him on Monday or Tues next week for an update.

## 2018-11-02 NOTE — Telephone Encounter (Signed)
Sarah, I really wasn't sure what to do to help Stephen Meadows.  I looked in [facility] and found just an EMS report which states that pt had refused transport to the BED yesterday.    I put in an urgent referral for ambulatory Case Management in hopes of trying to have Katheren Shams reach out to the pt with any potential community resources.  Please review note below and sign referral if agreeable.  I also placed his EMS report in your in-box to review.  Thanks.

## 2018-11-03 NOTE — Progress Notes (Signed)
.  logo    This note will not be viewable in MyChart.    Name: Stephen Meadows    DOB: 08-28-1960    Date:  11/03/2018 8:15 AM     Social work/ [facility] Complex Care Management Encounter    Reason for Referral: This patient has been identified for Care Management based on a provider referral for help with resources- patient is homeless.       ED/ Hospitalizations:     Pt has had 1 ED visits and 0 admissions in the last year.   Date and reason for recent utilization: 11/01/2018- MH needs- in crisis   Did patient call provider prior to most recent utilization No  Does patient have follow up with provider No- patient notified practice and received telephonic support  Where to go for Care handout provided No    PATIENT CONCERNS:   Homeless- has a camper but needs power.     PSYCHOSOCIAL/ BARRIER TO CARE: (social documentation updated -see history tab)     Attempt 1: SW attempted to reach patient today- left message requesting call back. [name] 11/03/2018 8:25 AM     2nd attempt- SW contacted patient today to offer LSW supports.   Patient reports that his biggest need right now is heat for his 9ft Camper. He stated that he has been kicked out of several places already and is now at the [employer].   Income is $1200  NO VA connections- states that was his ex who was connected and he no longer receives [place] benefits.     Patient has no source of electricity but heat is his biggest concerns. He states he has no money for a generator; has to use his check to fix his truck.     Reports he has a fiance who will be moving to [place] - he is not sure when this will be. She is allegedly getting an inheritance and he reports once this happens he will be all set.     Office referred patient to [employer] [facility] for Medicare help.     Patient has no natural supports; no place to live temporarily; and no resources to help with expenses. He states he has been homeless since July.    SW stated she would look into cost for  generators and funding sources- SW will also explore other options. Once researched SW will call patient back.   [name] 11/07/2018 12:25 PM     SW contacted [employer] to discuss funding options.  One issues would it would take a few weeks to get the payment and it could not be made to patient- SW unable to find an small generator in stock at local businesses and therefore it would have to be prepaid before ordering which could not happen for several weeks.   Discussed option of battery operated heaters- SW will explore this  Also discussed possible options of purchasing blankets and warm clothes to help if unable to purchase a heating source.   [name] 11/07/2018 12:29 PM

## 2018-11-03 NOTE — Telephone Encounter (Signed)
Spoke with the pt and he said that he is doing better today.  He thinks he would do better if he had his medication.  I explained that it is open enrollment time for Medicare Part D. I provided him with [employer] phone # to possibly assist him with this and some of his other social issues.  He said that he will call them now.  I will call pt for an update next week.
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
## 2018-11-08 ENCOUNTER — Telehealth

## 2018-11-08 NOTE — Progress Notes (Signed)
 .logo    This note will not be viewable in MyChart.    Name: Stephen Meadows    DOB: [birth year]    Date:  11/08/2018 8:33 AM     Social work/ [facility] Complex Care Management Encounter    Reason for Referral: This patient has been identified for Care Management based on a provider referral for resources    PATIENT CONCERNS:   Heat/staying warm  Food  Truck needs repair    PSYCHOSOCIAL/ BARRIER TO CARE: (social documentation updated -see history tab)     SW spoke with patient today. He states that he gets paid the middle of this month. SW discussed what bills patient has. He reports the following     $330- $450 for Truck Repair  $300 or so for [employer]- not sure of cost  Phone bill- unsure of cost  Food expenses- $150 (over income for food stamps)  Medications- which could cost over $150 dollars.     Patient states he might have enough left over to purchase a very small generator.  Patient is currently at [facility] but is not sure how long he will be able to stay here due to him being asked to leave several other businesses.     Patient states that his Fiance does not have any money to assist him- she is waiting to receive an inheritance- unsure when this will happen.    Patient plans to move once he gets his truck repaired- [place].    Discussed home less shelters- patient is afraid that if he leaves his camper it will be towed. He typically is only allowed to stay places one night before he is asked to leave.      Discussed food pantries- patient over income for food stamps and general assistance.     No natural supports to help him.   SW in agreement to request funds from [employer] fund to help with warm clothing and a thermal sleeping bag. [name] 11/08/2018 10:54 AM     SW completed the funding request form and submitted it to [employer] for review. SW was able to access some food from food bank to bring to patient.  [name] 11/08/2018 10:54 AM     Spoke with [employer]- Question on how much the truck repair would cost as they might be able to help with this expense but they would need a written estimate.  SW in agreement to contact patient about this. [name] 11/08/2018 12:52 PM     SW spoke with patient- he stated he was given a verbal estimate from [facility] [employer] - [phone]. He does not have a written estimate. SW advised that in order to request funding a written estimate would be needed.  Patient consented to SW contacting the business. [name] 11/08/2018 12:53 PM     SW contacted [employer] at [phone]- spoke with owner who stated that he would need to see the truck in order to give an estimate.  He stated that patient can contact them to make arrangements for this.  [name] 11/08/2018 12:54 PM     SW spoke with patient - advised him of the above- he is in agreement to contact them [name] 11/08/2018 12:54 PM   Patient called back- he is going to bring the truck in to be looked at tomorrow at 9:00 am. He is aware that a written  estimate will be needed in order to submit a funding request. SW gave patient Fax number to where estimate can be sent [phone]- [employer]). Patient in agreement to contact SW as soon as he has the estimate. [name] 11/08/2018 1:05 PM       PROGRESS TOWARDS GOALS: UPDATED? Yes   Goals        Patient Stated    . resrouces (pt-stated)      I need financial help.  SW working with patient on Water quality scientist and funding to help with needs. SW working closely with [employer]. [name] 11/08/2018 1:06 PM                IMPORTANT UPCOMING APPOINTMENTS:  Future Appointments   Date Time Provider Department Center   01/02/2019  1:00 PM Dobrinick, Grayce LABOR, FNP [facility]        SUMMARY/PLAN: SW will work with patient tomorrow to obtain a written estimate. SW will then work with [employer] on funding  request.     NEXT CARE MANAGEMENT ENCOUNTER: this week     [name] [name] 11/08/2018 1:07 PM

## 2018-11-08 NOTE — Telephone Encounter (Signed)
Call to pt to follow up on notes below. Pt reports has been working with a Child psychotherapist and speaking with the social worker all day today, pt states dampness was a result of condensation, he later noticed the entire camper was damp. Pt reports this was not a result of sweating or bodily fluid. Pt states social worker is bringing cloths, food and dry shoes over today. Pt states no immediate health concerns, feels safe. And expresses thanks for all the help the office has provided. Pt reports would like to start Venlafaxine again, reviewed information noted below about open enrollment, pt states aware and is working with social work to find programs and to try to enroll in insurance to help get medications covered. Pt states no further questions at this time. Pt reports will call if any further questions or concerns arise. Pt reports no further questions at this time.     Forwarding back to nurses pool and [place] as noted below was planning to call pt for up date this week.       Donne Anon RN  1:44 PM 11/08/18

## 2018-11-08 NOTE — Telephone Encounter (Signed)
Pt name and DOB verified.    Pt calling asking to speak to Stephen Meadows, Pt states he is living in a camper w/o heat and woke up this morning and its all damp, Pt says that his clothes and bedding are all wet, requesting callback.    [phone] (home)   [name]

## 2018-11-08 NOTE — Telephone Encounter (Signed)
This encounter was created in error - please disregard.

## 2018-11-08 NOTE — Progress Notes (Signed)
.logo    This note will not be viewable in [place].    Name: Stephen Meadows    DOB: 1960-10-21    Date:  11/08/2018 8:33 AM     Social work/ Healthguide Complex Care Management Encounter    Reason for Referral: This patient has been identified for Care Management based on a provider referral for resources    PATIENT CONCERNS:   Heat/staying warm  Food  Truck needs repair    PSYCHOSOCIAL/ BARRIER TO CARE: (social documentation updated -see history tab)     SW spoke with patient today. He states that he gets paid the middle of this month. SW discussed what bills patient has. He reports the following     $330- $450 for Truck Repair  $300 or so for [employer]- not sure of cost  Phone bill- unsure of cost  Food expenses- $150 (over income for food stamps)  Medications- which could cost over $150 dollars.     Patient states he might have enough left over to purchase a very small generator.  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     Patient states that his Fiance does not have any money to assist him- she is waiting to receive an inheritance- unsure when this will happen.    Patient plans to move once he gets his truck repaired- he is going to head [place] where it is warmer.    Discussed home less shelters- patient is afraid that if he leaves his camper it will be towed. He typically is only allowed to stay places one night before he is asked to leave.      Discussed food pantries- patient over income for food stamps and general assistance.     No natural supports to help him.   SW in agreement to request funds from [employer] fund to help with warm clothing and a thermal sleeping bag. [name] 11/08/2018 10:54 AM     SW completed the funding request form and submitted it to [place] for review. SW was able to access some food from food bank to  bring to patient. [name] 11/08/2018 10:54 AM     Spoke with [employer]- Question on how much the truck repair would cost as they might be able to help with this expense but they would need a written estimate.  SW in agreement to contact patient about this. [name] 11/08/2018 12:52 PM     SW spoke with patient- he stated he was given a verbal estimate from [place] [employer] [phone]. He does not have a written estimate. SW advised that in order to request funding a written estimate would be needed.  Patient consented to SW contacting the business. [name] 11/08/2018 12:53 PM     SW contacted [employer] at [phone]- spoke with owner who stated that he would need to see the truck in order to give an estimate.  He stated that patient can contact them to make arrangements for this.  [name] 11/08/2018 12:54 PM     SW spoke with patient - advised him of the above- he is in agreement to contact them [name] 11/08/2018 12:54 PM   Patient called back- he is going to bring the truck in to be looked at tomorrow at 9:00 am. He is aware that a written  estimate will be needed in order to submit a funding request. SW gave patient Fax number to where estimate can be sent [phone]- [employer]). Patient in agreement to contact SW as soon as he has the estimate. [name] 11/08/2018 1:05 PM       PROGRESS TOWARDS GOALS: UPDATED? Yes   Goals        Patient Stated    ??? resrouces (pt-stated)      I need financial help.  SW working with patient on Water quality scientist and funding to help with needs. SW working closely with [employer]. [name] 11/08/2018 1:06 PM                IMPORTANT UPCOMING APPOINTMENTS:  Future Appointments   Date Time Provider Department Center   01/02/2019  1:00 PM Dobrinick, Jamison Neighbor, FNP [facility]        SUMMARY/PLAN: SW will work with patient tomorrow to obtain a written  estimate. SW will then work with [employer] on funding request.     NEXT CARE MANAGEMENT ENCOUNTER: this week     [name] 11/08/2018 1:07 PM

## 2018-11-09 NOTE — Progress Notes (Signed)
.  logo    This note will not be viewable in MyChart.    Name: Stephen Meadows    DOB: 12-Jul-1960    Date:  11/09/2018 9:14 AM     Social work/ Healthguide Complex Care Management Encounter    Reason for Referral: This patient has been identified for Care Management based on a provider referral for help with resources.       PATIENT CONCERNS:   Truck repairs'  [employer] and financial resources    PSYCHOSOCIAL/ BARRIER TO CARE: (social documentation updated -see history tab)   Patient contacted SW- he states that the estimate is $305 for his truck.  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  Patient stated that the automotive place wants to speak with SW.  [name] 11/09/2018 9:16 AM     SW spoke with [facility] [employer]- owner confirmed that he was able to give an estimate to patient and stated his fax machine was not working.  He asked that SW call back at 9:30 to provide him with an email address. He is not in the shop presently. [name] 11/09/2018 9:17 AM       PROGRESS TOWARDS GOALS: UPDATED? Yes   Goals        Patient Stated    . resrouces (pt-stated)      I need financial help.  SW working with patient on Water quality scientist and funding to help with needs. SW working closely with [employer]. [name] 11/08/2018 1:06 PM                IMPORTANT UPCOMING APPOINTMENTS:  Future Appointments   Date Time Provider Department Center   01/02/2019  1:00 PM Dobrinick, Grayce LABOR, FNP [facility]      SUMMARY/PLAN: SW will work with [employer] committee to try to access funding for truck repairs and possibly funding for warm clothes/blankets.     If approved- patient will then be able to purchase a generator to help run his heater inside his camper to help keep warm.   [name] [name] 11/10/2018 8:01 AM

## 2018-11-09 NOTE — Telephone Encounter (Signed)
Noted and forwarded to PCP as an fyi.

## 2018-11-09 NOTE — Progress Notes (Signed)
.  logo    This note will not be viewable in MyChart.    Name: Stephen Meadows    DOB: 02/13/1960    Date:  11/09/2018 9:14 AM     Social work/ Healthguide Complex Care Management Encounter    Reason for Referral: This patient has been identified for Care Management based on a provider referral for help with resources.       PATIENT CONCERNS:   Truck repairs'  Heat and financial resources    PSYCHOSOCIAL/ BARRIER TO CARE: (social documentation updated -see history tab)   Patient contacted SW- he states that the estimate is $305 for his truck.  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  Patient stated that the automotive place wants to speak with SW.  Petrea Fredenburg 11/09/2018 9:16 AM     SW spoke with [employer]- owner confirmed that he was able to give an estimate to patient and stated his fax machine was not working.  He asked that SW call back at 9:30 to provide him with an email address. He is not in the shop presently. Breon Diss 11/09/2018 9:17 AM       PROGRESS TOWARDS GOALS: UPDATED? Yes   Goals        Patient Stated    ??? resrouces (pt-stated)      I need financial help.  SW working with patient on accessing resource and funding to help with needs. SW working closely with [employer]. Jaelynn Currier 11/08/2018 1:06 PM                IMPORTANT UPCOMING APPOINTMENTS:  Future Appointments   Date Time Provider Department Center   01/02/2019  1:00 PM Dobrinick, Robin A, FNP [facility]      SUMMARY/PLAN: SW will work with [place]'s [employer] committee to try to access funding for truck repairs and possibly funding for warm clothes/blankets.     If approved- patient will then be able to purchase a generator to help run his heater inside his camper to help keep warm.   Lucerito Rosinski Giuseppina Quinones 11/10/2018 8:01 AM

## 2018-11-10 NOTE — Progress Notes (Signed)
.  logo    This note will not be viewable in MyChart.    Name: Stephen Meadows    DOB: 09-Sep-1960    Date:  11/10/2018 3:14 PM     Social work/ Healthguide Complex Care Management Encounter    Reason for Referral: This patient has been identified for Care Management based on a provider referral for help with resources.     PSYCHOSOCIAL/ BARRIER TO CARE: (social documentation updated -see history tab)       COVID screening questions prior to face-to-face visit    1. Do you have a fever or shaking chills? No  2. Do you have shortness of breath that is new in the last 14 days? No  3. Do you have a sore throat that is new in the last 14 days No  4. Do you think you have been exposed in the last 14 days to someone who is sick with COVID-19? No  5. Have you been tested for COVID-19 in the last 14 days? No     SW was able to get funding approved through the [employer]- committee approved [phone] for needed truck repairs and $150 for SW to purchase needed clothing, bedding and food items.    SW contacted patient and advised him of the above. SW then purchased needed items and delivered them to patient this afternoon.  PPE was worn by both parties during exchange of items.   Patient tearful during the exchange and kept saying how much he appreciated this.     PROGRESS TOWARDS GOALS: UPDATED? Yes   Goals        Patient Stated    . resrouces (pt-stated)      I need financial help.  SW working with patient on Water quality scientist and funding to help with needs. SW working closely with [employer]. [name] 11/08/2018 1:06 PM            SUMMARY/PLAN: SW will plan to discharge patient from LSW supports if no further needs. Patient is all set to call to schedule his repairs to his truck and reports he will do this today.    If no call back from patient within one week- SW will plan to discharge     [name] [name] 11/10/2018 3:30 PM

## 2018-11-10 NOTE — Progress Notes (Signed)
.  logo    This note will not be viewable in MyChart.    Name: Stephen Meadows    DOB: 03/08/1960    Date:  11/10/2018 3:14 PM     Social work/ Healthguide Complex Care Management Encounter    Reason for Referral: This patient has been identified for Care Management based on a provider referral for help with resources.     PSYCHOSOCIAL/ BARRIER TO CARE: (social documentation updated -see history tab)       COVID screening questions prior to face-to-face visit    1. Do you have a fever or shaking chills? No  2. Do you have shortness of breath that is new in the last 14 days? No  3. Do you have a sore throat that is new in the last 14 days No  4. Do you think you have been exposed in the last 14 days to someone who is sick with COVID-19? No  5. Have you been tested for COVID-19 in the last 14 days? No     SW was able to get funding approved through the [employer] fund- committee approved $305 for needed truck repairs and $150 for SW to purchase needed clothing, bedding and food items.    SW contacted patient and advised him of the above. SW then purchased needed items and delivered them to patient this afternoon.  PPE was worn by both parties during exchange of items.   Patient tearful during the exchange and kept saying how much he appreciated this.     PROGRESS TOWARDS GOALS: UPDATED? Yes   Goals        Patient Stated    ??? resrouces (pt-stated)      I need financial help.  SW working with patient on accessing resource and funding to help with needs. SW working closely with [employer]. Addie Cederberg 11/08/2018 1:06 PM            SUMMARY/PLAN: SW will plan to discharge patient from LSW supports if no further needs. Patient is all set to call to schedule his repairs to his truck and reports he will do this today.    If no call back from patient within one week- SW will plan to discharge     Eugine Bubb  Eilleen Davoli 11/10/2018 3:30 PM

## 2018-11-16 NOTE — Progress Notes (Signed)
.  logo    This note will not be viewable in MyChart.    Name: Stephen Meadows    DOB: 11/04/1960    Date:  11/16/2018 11:02 AM     Social work/ Healthguide Complex Care Management Encounter    Reason for Referral: This patient has been identified for Care Management based on a provider referral for help with resources.   PATIENT CONCERNS:   Truck Armed forces logistics/support/administrative officer BARRIER TO CARE: (social documentation updated -see history tab)   Patient left message for SW to call back- stated he needed to talk to SW about his truck. [name] 11/16/2018 11:03 AM     SW placed call to patient- he reports that he is not able to get his truck fixed until next week. He stated that the autobody shop does not have the letter from [facility] stating they will pay.  He stated that they will not do the work until they have this letter. Patient reports father is not doing well and he needs to get out of here as soon as possible. SW will follow up with [employer] and will get back to patient. [name] 11/16/2018 11:05 AM     SW spoke with [employer]- letter of intent went out on 11/11/2018. Medford Forbes at [employer]- she will e-mail SW the letter of intent; she also needs a copy of autobody's W-9 in order to submit for payment.  [name] 11/16/2018 11:06 AM     SW attempted to reach [employer] at [phone] -left a message requesting call back. [name] 11/16/2018 11:06 AM     SW emailed letter of intent to owner along with W-9 form. SW asked that he contact SW with any questions. [name] 11/16/2018 2:13 PM       11/17/2018- Patient left SW a message asking for any updates. SW attempted to return the call- left a message stating that SW had left a message with the autobody shop and had emailed owner the letter of intent and the W-9 form.  [name] 11/20/2018 7:34 AM     PROGRESS TOWARDS GOALS: UPDATED? Yes   Goals        Patient Stated    . resrouces  (pt-stated)      I need financial help.  SW working with patient on Water quality scientist and funding to help with needs. SW working closely with [employer]. [name] 11/08/2018 1:06 PM     Funds approved for car repairs,clothing, bedding and food. [name] 11/10/2018 3:26 PM              SUMMARY/PLAN:  If no call back from patient- SW will plan to discharge patient within one week. Patient had reported that once his truck was fix he was [place] see his [place].      [name]  [name] 11/20/2018 7:35 AM

## 2018-11-16 NOTE — Progress Notes (Signed)
.  logo    This note will not be viewable in MyChart.    Name: Stephen Meadows    DOB: 01/16/1960    Date:  11/16/2018 11:02 AM     Social work/ Healthguide Complex Care Management Encounter    Reason for Referral: This patient has been identified for Care Management based on a provider referral for help with resources.   PATIENT CONCERNS:   Truck Adult nurse BARRIER TO CARE: (social documentation updated -see history tab)   Patient left message for SW to call back- stated he needed to talk to SW about his truck. [name] 11/16/2018 11:03 AM     SW placed call to patient- he reports that he is not able to get his truck fixed until next week. He stated that the autobody shop does not have the letter from [facility] stating they will pay.  He stated that they will not do the work until they have this letter. Patient reports father is not doing well and he needs to "get out of here" as soon as possible. SW will follow up with [place] and will get back to patient. [name] 11/16/2018 11:05 AM     SW spoke with [place]- letter of intent went out on 11/11/2018. Arville Go at [employer]- she will e-mail SW the letter of intent; she also needs a copy of autobody's W-9 in order to submit for payment.  [name] 11/16/2018 11:06 AM     SW attempted to reach [employer] shop at [phone] -left a message requesting call back. [name] 11/16/2018 11:06 AM     SW emailed letter of intent to owner along with W-9 form. SW asked that he contact SW with any questions. [name] 11/16/2018 2:13 PM       11/17/2018- Patient left SW a message asking for any updates. SW attempted to return the call- left a message stating that SW had left a message with the autobody shop and had emailed owner the letter of intent and the W-9 form.  [name] 11/20/2018 7:34 AM     PROGRESS TOWARDS GOALS: UPDATED? Yes   Goals        Patient Stated     ??? resrouces (pt-stated)      I need financial help.  SW working with patient on Engineer, civil (consulting) and funding to help with needs. SW working closely with [employer]. [name] 11/08/2018 1:06 PM     Funds approved for car repairs,clothing, bedding and food. [name] 11/10/2018 3:26 PM              SUMMARY/PLAN:  If no call back from patient- SW will plan to discharge patient within one week. Patient had reported that once his truck was fix he was going to head [place] to see his dad.      [name]  [name] 11/20/2018 7:35 AM

## 2018-11-16 NOTE — Telephone Encounter (Signed)
Pt name DOB verified:    Concern/Problem: Pt is calling to check on the status of the paperwork that his social worker was going to send to this office so pt can have his truck repaired. Please advise and call pt back.       Call back #: [phone] (home)      Stephen Meadows [facility]

## 2018-11-23 NOTE — Progress Notes (Signed)
.  logo    This note will not be viewable in MyChart.    Name: Stephen Meadows    DOB: Jun 01, 1960    Date:  11/23/2018 9:50 AM     Social work/ Healthguide Complex Care Management Encounter    Reason for Referral:   This patient has been identified for Care Management based on a provider referral for help with resources.       Patient left SW a message stating he needed to speak with her. SW attempted to reach patient- left a message requesting call back. [name] 11/23/2018 9:52 AM     SW spoke with patient - he reports that he brought his truck down to be fixed and they told him that [facility] was no longer going to pay for the work.  Patient is very upset. SW advised patient that this was not the arrangement that was made and that SW sent letter of intent to owner. SW will contact the auto body shop at [phone] to clarify. [name] 11/23/2018 11:01 AM     SW attempted to reach owner of shop- left a message asking for call back as soon as possible regarding the work that needs to be done on patient's truck.  [name] 11/23/2018 11:02 AM

## 2018-11-23 NOTE — Progress Notes (Signed)
.  logo    This note will not be viewable in MyChart.    Name: Stephen Meadows    DOB: 07/03/1960    Date:  11/23/2018 9:50 AM     Social work/ [facility] Complex Care Management Encounter    Reason for Referral:   This patient has been identified for Care Management based on a provider referral for help with resources.       Patient left SW a message stating he needed to speak with her. SW attempted to reach patient- left a message requesting call back. Lekia Nier 11/23/2018 9:52 AM     SW spoke with patient - he reports that he brought his truck down to be fixed and they told him that [employer] was no longer going to pay for the work.  Patient is very upset. SW advised patient that this was not the arrangement that was made and that SW sent letter of intent to owner. SW will contact the auto body shop at [phone] to clarify. Surah Pelley 11/23/2018 11:01 AM     SW attempted to reach owner of shop- left a message asking for call back as soon as possible regarding the work that needs to be done on patient's truck.  Jumar Greenstreet 11/23/2018 11:02 AM

## 2018-11-23 NOTE — Telephone Encounter (Signed)
9Patient verified by name and DOB.    Patient states he had bad news this morning that he wouldn't be able to get his truck fixed, as [employer] denied the request. His ill father is in [place], so that would be a long drive with an ailing truck.     I told him that Lawerance Cruel had tried to reach him, but has been unable to and that she had emailed the autobody shop the letter of intent and the W9.    Routed to Raye Sorrow, MSN, RN, 11/23/2018 at 9:44 AM

## 2018-11-24 NOTE — Progress Notes (Signed)
Marland Kitchen  logo    This note will not be viewable in MyChart.    Name: Stephen Meadows    DOB: 1960/08/17    Date:  11/24/2018 11:28 AM     Social work/ Healthguide Complex Care Management Encounter    Reason for Referral: This patient has been identified for Care Management based on a provider referral for needs help with resources.     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 [name] 11/24/2018 11:28 AM     Patient contacted SW stated that [employer] only deal with cash and they did not want to complete the w-9 form so they informed patient that they could not complete work on the truck.    SW stated that patient could get a new estimate from a company as long as they were willing to send of the completed W-9 form.  SW stated that [facility] would pay for up to $300 for the work to be done but any amount in excess patient would need to pay.  Patient stated he had spoken with another automotive place prior to [facility] and he plans to check back with them. Patient will follow up with SW after speaking with them. [name] 11/24/2018 11:31 AM     SW contacted [employer] and explained the situation. They are willing to wait for a new estimate and will honor the approved amount as long as the business sends over the W-9. [name] 11/24/2018 11:33 AM     Patient called back- stated he spoke with Nadine Counts at [facility] [employer]- [phone]. Patient bringing truck down to get an estimate. Patient asked that SW contact the auto body shop. [name] 11/24/2018 12:00 PM     SW contacted Nadine Counts- explained that [facility] would need a written estimate and w-9 faxed to [phone] Attn: Ramond Craver,   Nadine Counts stated he has all this info and will get it faxed over as soon as they take a look at the truck.  [name] 11/24/2018 12:01 PM

## 2018-11-24 NOTE — Progress Notes (Signed)
.  logo    This note will not be viewable in MyChart.    Name: Stephen Meadows    DOB: 10/28/1960    Date:  11/24/2018 11:28 AM     Social work/ Healthguide Complex Care Management Encounter    Reason for Referral: This patient has been identified for Care Management based on a provider referral for needs help with resources.     PATIENT CONCERNS:   Truck repairs    PSYCHOSOCIAL/ BARRIER TO CARE: (social documentation updated -see history tab)     SW attempted to reach [employer] for the third time- message as left. Mychaela Lennartz 11/24/2018 11:28 AM     Patient contacted SW stated that [employer] only deal with cash and they did not want to complete the w-9 form so they informed patient that they could not complete work on the truck.    SW stated that patient could get a new estimate from a company as long as they were willing to send of the completed W-9 form.  SW stated that [facility] would pay for up to $300 for the work to be done but any amount in excess patient would need to pay.  Patient stated he had spoken with another automotive place prior to [employer] and he plans to check back with them. Patient will follow up with SW after speaking with them. Davanta Meuser 11/24/2018 11:31 AM     SW contacted [employer] and explained the situation. They are willing to wait for a new estimate and will honor the approved amount as long as the business sends over the W-9. Shamiya Demeritt 11/24/2018 11:33 AM     Patient called back- stated he spoke with Bob at [employer]- [phone]. Patient bringing truck down to get an estimate. Patient asked that SW contact the auto body shop. Sharlie Shreffler 11/24/2018 12:00 PM     SW contacted Bob- explained that [facility] would need a written estimate and w-9 faxed to [phone] Attn: Chris Conent,   Bob stated he has all this info and will get it faxed over as soon as they take a look at the truck.  Frederica Chrestman 11/24/2018 12:01 PM

## 2018-11-27 NOTE — Progress Notes (Signed)
Marland Kitchen  logo    This note will not be viewable in MyChart.    Name: Stephen Meadows    DOB: 12/30/1960    Date:  11/27/2018 11:36 AM     Social work/ Healthguide Complex Care Management Encounter    Reason for Referral: This patient has been identified for Care Management based on a provider referral for needs help with resources.     PATIENT CONCERNS:   Truck Software engineer BARRIER TO CARE: (social documentation updated -see history tab)   SW contacted patient to see if he was able to get a written estimate- patient reports his truck is at the auto shop now. SW stressed to patient the [facility] will not PAY for any work until they have received the written estimate and sent a letter of intent to pay to the autobody shop.    Patient reports he is having battery issues as well- SW again stressed that no work in excess of the $300 will be approved by [facility] and that if the work exceeds this amount patient would need to pay this.    Patient expressed verbal understanding and stated he would call the autobody shop now and would get back to SW. [name] 11/27/2018 11:38 AM

## 2018-11-27 NOTE — Progress Notes (Signed)
.  logo    This note will not be viewable in MyChart.    Name: Stephen Meadows    DOB: 03/01/1960    Date:  11/27/2018 11:36 AM     Social work/ Healthguide Complex Care Management Encounter    Reason for Referral: This patient has been identified for Care Management based on a provider referral for needs help with resources.     PATIENT CONCERNS:   Truck repairs      PSYCHOSOCIAL/ BARRIER TO CARE: (social documentation updated -see history tab)   SW contacted patient to see if he was able to get a written estimate- patient reports his truck is at the auto shop now. SW stressed to patient the [facility] will not PAY for any work until they have received the written estimate and sent a letter of intent to pay to the autobody shop.    Patient reports he is having battery issues as well- SW again stressed that no work in excess of the $300 will be approved by 's and that if the work exceeds this amount patient would need to pay this.    Patient expressed verbal understanding and stated he would call the autobody shop now and would get back to SW. Mariyana Fulop 11/27/2018 11:38 AM

## 2018-11-28 NOTE — Progress Notes (Signed)
 .logo    This note will not be viewable in MyChart.    Name: Stephen Meadows    DOB: Feb 26, 1960    Date:  11/28/2018 8:28 AM     Social work/ Healthguide Complex Care Management Encounter    Reason for Referral: This patient has been identified for Care Management based on a provider referral for help with resources     PATIENT CONCERNS: Truck Repairs    PSYCHOSOCIAL/ BARRIER TO CARE: (social documentation updated -see history tab)   SW spoke with Medford at [employer] [facility] [employer]- she stated that she has received the estimate for $394.46- patient would need to pay the $94.96 as [facility] could only pay for $300.    Medford will have letter of intent send to auto body shop by the end of the week. Medford stated it was ok to give verbal consent for $300 to auto body shop and she would get the letter out as soon as possible. Stephen Meadows 11/28/2018 8:30 AM     Medford from [employer]- she stated that she received a new estimate of $885.32 from the body-she stated she did fax over the letter of intent for the $300.00 but patient must be aware that if he has work done that exceeds the $300 he will need to pay this. Stephen Meadows 11/28/2018 10:08 AM     SW spoke with patient advised him that any work done in excess of $300 would be his responsibility. SW advised him that a new estimate was sent over for $885.32- patient stated that they have to replace the whole hub . SW again stressed to patient that if he had that work done he would need to pay for the excess balance as [employer] would not be paying for it. Patient reports that he will talk to his fiance to see if she can help. Stephen Meadows 11/28/2018 10:10 AM     SW placed a call to [employer]- they are aware that [facility] is only able to pay $300.  Stephen Meadows is aware of this and stated that Stephen Meadows is aware of the total bill and is aware that he is responsible for the remainder. Truck has been fixed and is waiting for payment before it is  released.  Stephen Meadows will fax bill to [facility] for $300 Stephen Meadows 11/28/2018 10:16 AM       PROGRESS TOWARDS GOALS: UPDATED? Yes   Goals        Patient Stated    . resrouces (pt-stated)      I need financial help.  SW working with patient on Water quality scientist and funding to help with needs. SW working closely with [facility]. Stephen Meadows 11/08/2018 1:06 PM     Funds approved for car repairs,clothing, bedding and food. Stephen Meadows 11/10/2018 3:26 PM              SUMMARY/PLAN: Truck repairs exceeded expected amount- total is $885.32- [address] will pay $300 and patient and auto body shop are aware of this.   Patient will utilize his own resources to pay the remaining $585.32.     SW will plan to discharge patient from [facility] supports if no further needs are identified by [phone]  Stephen Meadows   Stephen Meadows 11/28/2018 10:18 AM      SW discharging patient from [facility] supports at this time as goals of care have been met and resources have been provided. Stephen Meadows 12/07/2018 1:13  PM

## 2018-11-28 NOTE — Telephone Encounter (Signed)
Noted and appreciated.

## 2018-11-28 NOTE — Progress Notes (Addendum)
.logo    This note will not be viewable in MyChart.    Name: Stephen Meadows    DOB: 10/01/1960    Date:  11/28/2018 8:28 AM     Social work/ Healthguide Complex Care Management Encounter    Reason for Referral: This patient has been identified for Care Management based on a provider referral for help with resources     PATIENT CONCERNS: Truck Repairs    PSYCHOSOCIAL/ BARRIER TO CARE: (social documentation updated -see history tab)   SW spoke with Chris at [employer] [facility]- she stated that she has received the estimate for $394.46- patient would need to pay the $94.96 as [facility] could only pay for $300.    Chris will have letter of intent send to auto body shop by the end of the week. Chris stated it was ok to give verbal consent for $300 to auto body shop and she would get the letter out as soon as possible. Ramonica Grigg 11/28/2018 8:30 AM     Chris from [employer] called SW- she stated that she received a new estimate of $885.32 from the body-she stated she did fax over the letter of intent for the $300.00 but patient must be aware that if he has work done that exceeds the $300 he will need to pay this. Betzaida Cremeens 11/28/2018 10:08 AM     SW spoke with patient advised him that any work done in excess of $300 would be his responsibility. SW advised him that a new estimate was sent over for $885.32- patient stated that they "have to replace the whole hub" . SW again stressed to patient that if he had that work done he would need to pay for the excess balance as [facility] would not be paying for it. Patient reports that he will talk to his fiance to see if she can help. Conley Delisle 11/28/2018 10:10 AM     SW placed a call to [employer]- they are aware that [facility] is only able to pay $300.  [employer] is aware of this and stated that Meryl is aware of the total bill and is aware that he is responsible for the remainder. Truck has been fixed and is waiting for payment before it is  released.  [employer] will fax bill to [facility] for $300 Cru Kritikos 11/28/2018 10:16 AM       PROGRESS TOWARDS GOALS: UPDATED? Yes   Goals        Patient Stated    ??? resrouces (pt-stated)      I need financial help.  SW working with patient on accessing resource and funding to help with needs. SW working closely with [facility]. Eurydice Calixto 11/08/2018 1:06 PM     Funds approved for car repairs,clothing, bedding and food. Aby Gessel 11/10/2018 3:26 PM              SUMMARY/PLAN: Truck repairs exceeded expected amount- total is $885.32- [facility] will pay $300 and patient and auto body shop are aware of this.   Patient will utilize his own resources to pay the remaining $585.32.     SW will plan to discharge patient from [facility] supports if no further needs are identified by 12.1.2020  Dorean Daniello   Arrow Emmerich 11/28/2018 10:18 AM      SW discharging patient from LSW supports at this time as goals of care have been met and resources have been provided. Bertine Schlottman 12/07/2018 1:13   PM

## 2018-11-28 NOTE — Telephone Encounter (Signed)
Sending to PCP for review and signature.

## 2018-12-26 ENCOUNTER — Emergency Department: Admit: 2018-12-26 | Payer: MEDICARE | Primary: Family

## 2018-12-26 ENCOUNTER — Inpatient Hospital Stay
Admit: 2018-12-26 | Discharge: 2018-12-27 | Disposition: A | Discharge status: Home / Self Care | Payer: MEDICARE | Attending: Emergency Medicine

## 2018-12-26 ENCOUNTER — Encounter: Primary: Family

## 2018-12-26 DIAGNOSIS — R1084 Generalized abdominal pain: Secondary | ICD-10-CM

## 2018-12-26 LAB — CBC WITH AUTOMATED DIFF
ABS. BASOPHILS: 0.1 10*3/uL (ref 0.0–0.1)
ABS. EOSINOPHILS: 0.4 10*3/uL (ref 0.0–0.5)
ABS. IMM. GRANS.: 0 10*3/uL (ref 0.00–0.04)
ABS. LYMPHOCYTES: 2.8 10*3/uL (ref 1.3–3.6)
ABS. MONOCYTES: 1 10*3/uL — ABNORMAL HIGH (ref 0.3–0.8)
ABS. NEUTROPHILS: 10.8 10*3/uL — ABNORMAL HIGH (ref 1.6–6.1)
BASOPHILS: 0 % (ref 0–2)
EOSINOPHILS: 2 % (ref 0–5)
HCT: 52.3 % — ABNORMAL HIGH (ref 42.0–52.0)
HGB: 17.7 g/dL (ref 14.0–18.0)
IMMATURE GRANULOCYTES: 0 % (ref 0.0–0.6)
LYMPHOCYTES: 19 % (ref 14–46)
MCH: 30.1 PG (ref 27.0–31.0)
MCHC: 33.8 g/dL (ref 33.0–37.0)
MCV: 88.8 FL (ref 80.0–94.0)
MONOCYTES: 7 % (ref 5–12)
MPV: 12.3 FL — ABNORMAL HIGH (ref 7.4–10.4)
NEUTROPHILS: 72 % (ref 47–80)
PLATELET: 421 10*3/uL — ABNORMAL HIGH (ref 130–400)
RBC: 5.89 M/uL (ref 4.70–6.10)
RDW: 14.5 % (ref 11.5–14.5)
WBC: 15.1 10*3/uL — ABNORMAL HIGH (ref 4.5–10.9)

## 2018-12-26 LAB — METABOLIC PANEL, COMPREHENSIVE
ALT (SGPT): 23 U/L (ref 12–78)
AST (SGOT): 10 U/L (ref 10–37)
Albumin: 3.8 g/dL (ref 3.4–5.0)
Alk. phosphatase: 95 U/L (ref 43–117)
BUN: 18 MG/DL (ref 7–22)
Bilirubin, total: 0.5 mg/dL (ref 0.00–1.00)
CO2: 29 mmol/L (ref 21–32)
Calcium: 9.1 MG/DL (ref 8.5–10.1)
Chloride: 107 mmol/L (ref 98–108)
Creatinine: 1.18 MG/DL (ref 0.70–1.30)
GFR est AA: >60 mL/min/{1.73_m2} (ref >60)
GFR est non-AA: >60 mL/min/{1.73_m2} (ref >60)
Glucose: 196 mg/dL — ABNORMAL HIGH (ref 74–106)
Potassium: 3.9 mmol/L (ref 3.4–5.1)
Protein, total: 7.1 g/dL (ref 6.4–8.2)
Sodium: 139 mmol/L (ref 136–145)

## 2018-12-26 LAB — COVID-19,INFLUENZA A/B,RSV PANEL
Influenza A by PCR: NEGATIVE
Influenza B by PCR: NEGATIVE
RSV by PCR: NEGATIVE
SARS-CoV-2 by PCR: NEGATIVE

## 2018-12-26 LAB — LIPASE
Lipase: 129 U/L (ref 73–393)
Lipase: 129 U/L (ref 73–393)

## 2018-12-26 LAB — COMPREHENSIVE METABOLIC PANEL
ALT: 23 U/L (ref 12–78)
AST: 10 U/L (ref 10–37)
Albumin: 3.8 g/dL (ref 3.4–5.0)
Alkaline Phosphatase: 95 U/L (ref 43–117)
BUN: 18 MG/DL (ref 7–22)
CO2: 29 mmol/L (ref 21–32)
Calcium: 9.1 MG/DL (ref 8.5–10.1)
Chloride: 107 mmol/L (ref 98–108)
Creatinine: 1.18 MG/DL (ref 0.70–1.30)
EGFR IF NonAfrican American: >60 mL/min/{1.73_m2} (ref >60)
GFR African American: >60 mL/min/{1.73_m2} (ref >60)
Glucose: 196 mg/dL — ABNORMAL HIGH (ref 74–106)
Potassium: 3.9 mmol/L (ref 3.4–5.1)
Sodium: 139 mmol/L (ref 136–145)
Total Bilirubin: 0.5 mg/dL (ref 0.00–1.00)
Total Protein: 7.1 g/dL (ref 6.4–8.2)

## 2018-12-26 LAB — CBC WITH AUTO DIFFERENTIAL
Basophils %: 0 % (ref 0–2)
Basophils Absolute: 0.1 10*3/uL (ref 0.0–0.1)
Eosinophils %: 2 % (ref 0–5)
Eosinophils Absolute: 0.4 10*3/uL (ref 0.0–0.5)
Granulocyte Absolute Count: 0 10*3/uL (ref 0.00–0.04)
Hematocrit: 52.3 % — ABNORMAL HIGH (ref 42.0–52.0)
Hemoglobin: 17.7 g/dL (ref 14.0–18.0)
Immature Granulocytes: 0 % (ref 0.0–0.6)
Lymphocytes %: 19 % (ref 14–46)
Lymphocytes Absolute: 2.8 10*3/uL (ref 1.3–3.6)
MCH: 30.1 PG (ref 27.0–31.0)
MCHC: 33.8 g/dL (ref 33.0–37.0)
MCV: 88.8 FL (ref 80.0–94.0)
MPV: 12.3 FL — ABNORMAL HIGH (ref 7.4–10.4)
Monocytes %: 7 % (ref 5–12)
Monocytes Absolute: 1 10*3/uL — ABNORMAL HIGH (ref 0.3–0.8)
Neutrophils %: 72 % (ref 47–80)
Neutrophils Absolute: 10.8 10*3/uL — ABNORMAL HIGH (ref 1.6–6.1)
Platelets: 421 10*3/uL — ABNORMAL HIGH (ref 130–400)
RBC: 5.89 M/uL (ref 4.70–6.10)
RDW: 14.5 % (ref 11.5–14.5)
WBC: 15.1 10*3/uL — ABNORMAL HIGH (ref 4.5–10.9)

## 2018-12-26 LAB — COVID-19 & INFLUENZA COMBO
RSV By PCR: NEGATIVE
Rapid Influenza A By PCR: NEGATIVE
Rapid Influenza B By PCR: NEGATIVE
SARS-CoV-2: NEGATIVE

## 2018-12-26 MED ORDER — KETOROLAC TROMETHAMINE 30 MG/ML INJECTION
30 mg/mL (1 mL) | INTRAMUSCULAR | Status: AC
Start: 2018-12-26 — End: 2018-12-26
  Administered 2018-12-26: 18:00:00 via INTRAVENOUS

## 2018-12-26 MED ORDER — ACETAMINOPHEN 325 MG TABLET
325 mg | ORAL | Status: AC
Start: 2018-12-26 — End: 2018-12-26
  Administered 2018-12-26: via ORAL

## 2018-12-26 MED ORDER — ONDANSETRON (PF) 4 MG/2 ML INJECTION
4 mg/2 mL | INTRAMUSCULAR | Status: AC
Start: 2018-12-26 — End: 2018-12-26
  Administered 2018-12-26: 18:00:00 via INTRAVENOUS

## 2018-12-26 MED ORDER — SODIUM CHLORIDE 0.9% BOLUS IV
0.9 % | Freq: Once | INTRAVENOUS | Status: AC
Start: 2018-12-26 — End: 2018-12-26
  Administered 2018-12-26: 18:00:00 via INTRAVENOUS

## 2018-12-26 MED ORDER — DICYCLOMINE 10 MG CAP
10 mg | ORAL | Status: AC
Start: 2018-12-26 — End: 2018-12-26
  Administered 2018-12-26: via ORAL

## 2018-12-26 MED ORDER — IOHEXOL 350 MG IODINE/ML INTRAVENOUS SOLUTION
350 mg iodine/mL | Freq: Once | INTRAVENOUS | Status: AC
Start: 2018-12-26 — End: 2018-12-26
  Administered 2018-12-26: 22:00:00 via INTRAVENOUS

## 2018-12-26 MED FILL — SODIUM CHLORIDE 0.9 % IV: INTRAVENOUS | Qty: 1000

## 2018-12-26 MED FILL — DICYCLOMINE 10 MG CAP: 10 mg | ORAL | Qty: 2

## 2018-12-26 MED FILL — KETOROLAC TROMETHAMINE 30 MG/ML INJECTION: 30 mg/mL (1 mL) | INTRAMUSCULAR | Qty: 1

## 2018-12-26 MED FILL — OMNIPAQUE 350 MG IODINE/ML INTRAVENOUS SOLUTION: 350 mg iodine/mL | INTRAVENOUS | Qty: 500

## 2018-12-26 MED FILL — ONDANSETRON (PF) 4 MG/2 ML INJECTION: 4 mg/2 mL | INTRAMUSCULAR | Qty: 2

## 2018-12-26 MED FILL — ACETAMINOPHEN 325 MG TABLET: 325 mg | ORAL | Qty: 2

## 2018-12-26 NOTE — ED Notes (Signed)
Homeless man presenting for evaluation of nausea and diarrhea after eating spaghetti a roll and drinking coffee around 1830 last evening. States has been having progressive weakness prior to this episode. States has not taken any medications in at least 2 months. Does smoke but denies alcohol or drug use. Pt is hard of hearing. States does not taste or smell well but this is not not for him.

## 2018-12-26 NOTE — Progress Notes (Signed)
Admission: MED RECONCILIATION    Comments/Recommendations: This writer left medications that are still active and have refills remaining even though notes indicate patient is not taking any medications.    Medications added:     Hydroxyzine pamoate 25 mg cap    Medications removed:    Bupropion XL 150 mg tab  Doxepin 10 mg cap  Ibuprofen 200 mg tab  Triamcinolone 0.1% top cream    Medications adjusted:    N/A    Information obtained from: encounter notes, [place], [place]          Allergies: Patient has no known allergies.    Prior to Admission Medications:     Medication Documentation Review Audit       Reviewed by Dwaine Deter, CPHT (Technician) on 12/26/18 at 1258      Medication Sig Documenting Provider Last Dose Status Taking?   gabapentin (NEURONTIN) 100 mg capsule Take 1 BID Gruen, Kimberlee Nearing, DO Not Taking Unknown time Active No           Med Note Clovis Fredrickson, SHERRY L   Tue Dec 26, 2018 12:51 PM) Last dispensed 07/04/18 @ [facility]. Picked up 07/10/18 for a 30 day supply # 60 with 1 refill remaining.   hydrOXYzine pamoate (VISTARIL) 25 mg capsule Take 25-50 mg by mouth nightly as needed for Anxiety or Sleep. Provider, Historical  Active Yes           Med Note Clovis Fredrickson, SHERRY L   Tue Dec 26, 2018 12:57 PM) Last dispensed / sold @ [facility] 05/08/18 for a 30 day supply # 60 with refills remaining.   venlafaxine (EFFEXOR) 37.5 mg tablet Take 1 Tab by mouth three (3) times daily. Indications: major depressive disorder Dobrinick, Janace Hoard, FNP Not Taking Unknown time Active No           Med Note Clovis Fredrickson, SHERRY L   Tue Dec 26, 2018 12:55 PM) Last dispensed/sold/picked up 10/13/18 @ [facility] for a 6 day supply # 18. Total medication count # 702 remaining from a prescription submitted for 90 day supply # 270.                      Dwaine Deter, CPHT   Contact: [phone]  Reviewed by Laurie Panda, PharmD  Contact: [phone] or via [place]

## 2018-12-26 NOTE — ED Provider Notes (Signed)
HPI chief complaint:  Diarrhea, nausea.  [age] patient.  Homeless.  Lives in his camper.  A friend dropped off some spaghetti with meatballs last night and he ate the meal.  Soon after eating at he developed nausea and then diarrhea.  He estimates more than 10 episodes of loose stool.  Denies any bloody or black stool.  He has nausea and dry heaves.  He denies any significant vomiting.  He has no prior history of any abdominal surgery.  He does have history of diverticulitis but no pancreatitis.  He has no history of gallstones.  No fever.  No known coronavirus exposure that he is aware.  No cough or chest pain or shortness of breath.  He states his urine output appears dark in color.  He denies any hematuria or dysuria.  Denies any flank pain.  Some generalized abdominal pain.  He called his PCP and they advised him to come here.  He has is treating getting checked for coronavirus.  He has no known coronavirus exposure.  At 1 time was told he was prediabetic.  He has some blurry vision.  He has not checked his sugar recently.  He has some generalized weakness and headache.  No rash.  No stiff neck.  No recent alcohol.    Past Medical History:   Diagnosis Date   ??? Moderate episode of recurrent major depressive disorder ([place]) 03/22/2018   ??? Prediabetes 03/22/2018       Past Surgical History:   Procedure Laterality Date   ??? HX CARPAL TUNNEL RELEASE Right 2004   ??? HX ORTHOPAEDIC Right 02/2018    [address] off of ladder and shattered his right heel. Dr. Marcelline Deist at [facility] [place]         Family History:   Problem Relation Age of Onset   ??? Diabetes Mother    ??? Pancreatic Cancer Mother    ??? Melanoma Mother    ??? Diabetes Father    ??? Stroke Father         on blood thinners       Social History     Socioeconomic History   ??? Marital status: MARRIED     Spouse name: Not on file   ??? Number of children: Not on file   ??? Years of education: Not on file   ??? Highest education level: Not on file   Occupational History   ???  Not on file   Social Needs   ??? Financial resource strain: Not on file   ??? Food insecurity     Worry: Not on file     Inability: Not on file   ??? Transportation needs     Medical: Not on file     Non-medical: Not on file   Tobacco Use   ??? Smoking status: Current Every Day Smoker     Packs/day: 0.50     Years: 40.00     Pack years: 20.00     Types: Cigarettes     Start date: 29     Last attempt to quit: 03/05/2018     Years since quitting: 0.8   ??? Smokeless tobacco: Never Used   ??? Tobacco comment: 1.5 pack a day stopped on 03/05/18 resumed on 04/05/18   Substance and Sexual Activity   ??? Alcohol use: Not Currently     Frequency: Never     Drinks per session: 1 or 2     Binge frequency: Never   ??? Drug use:  Yes     Frequency: 7.0 times per week     Types: Marijuana   ??? Sexual activity: Yes     Partners: Female   Lifestyle   ??? Physical activity     Days per week: Not on file     Minutes per session: Not on file   ??? Stress: Not on file   Relationships   ??? Social Wellsite geologistconnections     Talks on phone: Not on file     Gets together: Not on file     Attends religious service: Not on file     Active member of club or organization: Not on file     Attends meetings of clubs or organizations: Not on file     Relationship status: Not on file   ??? Intimate partner violence     Fear of current or ex partner: Not on file     Emotionally abused: Not on file     Physically abused: Not on file     Forced sexual activity: Not on file   Other Topics Concern   ??? Not on file   Social History Narrative    Marital status: Married, Vernona RiegerLaura - 9 years    Living situation: Own home in [place]     Pets in the home: 2 dogs, 1 cat     Children: None- 2 step children     Occupation: Disabled    Highest education: HS    Alcohol,tobacco, drug EAV:WUJWJXuse:smoker and marijuana use     Caffeine/ diet: Keto,  Decaf coffee    Exercise: None     Military- None                  ALLERGIES: Patient has no known allergies.    Review of Systems   Review of systems:  Please see HPI.   Otherwise reviewed and negative.    Vitals:    12/26/18 1212   BP: 139/70   Pulse: 89   Resp: 16   Temp: 98.2 ??F (36.8 ??C)   SpO2: 97%            Physical Exam General appearance:  Well-developed well-nourished patient  sitting up in bed.  He appears somewhat fatigued.  He is a fairly good historian.  Vital signs reviewed.  No fever.  No tachycardia.  No hypoxia.  Not coughing not vomiting in the ER on arrival.  HEENT exam:  Normocephalic atraumatic pupils equally round and reactive to light.  Extraocular muscles intact. Pharynx exam:  No erythema.  No exudate. No trismus.  Neck exam:  Supple, full range of motion.  Lung exam:  Clear to auscultation. No rhonchi.  No crackles.  No retractions.  No wheezes.  Heart exam:  Regular rate rhythm. No murmurs  Abdominal exam:  Nondistended.  Some mild epigastric tenderness.  No right lower quadrant tenderness.  No right upper quadrant tenderness.  No CVA tenderness.    Bowel sounds are present.  No guarding or rebound.  MSK exam:  Full range of motion all 4 extremities without pain. No visible cellulitis on the exposed skin.  Neurologic exam:  Awake and alert.  Follows commands.  Motor 5/5.  Sensation intact.  Grossly nonfocal.    MDM  Number of Diagnoses or Management Options      ICD-10-CM ICD-9-CM    1. Abdominal pain, generalized  R10.84 [phone]       58 year old patient here with acute GI illness mainly diarrhea.  No  bloody or black stool.  Some nausea and dry heaves.  May be related to a meal a friend gave him?  He has no fever and no respiratory complaints here.  He has some epigastric tenderness.  He has history of diverticulitis but no lower abdominal tenderness.  Plan to do a coronavirus swab as well as he is worried about that.  Because he is homeless and then do a rapid want have a result today.  Plan to give some intravenous fluids laboratory tests and medicate him.  I do not think he needs any imaging as of yet  ED Course as of Dec 25 1924   Tue Dec 26, 2018    1401 CBC shows leukocytosis of 15,000.  Hemoglobin is 17.7.  Platelet count 4 2 1  K.  Normal sodium, potassium, CO2.  Glucose is 196.  Normal GFR lipase.    [RR]   1432 Coronavirus negative.      [RR]   1550 Update:  Seen again at 3:45 p.m.  Awake and alert.  He has had no vomiting no diarrhea since he has been here.  He states he has got some pain.  On exam he has some right mid lateral abdominal pain.  He has a history of diverticulitis.  He denies any prior abdominal surgery.  Since he has had some nausea and diarrhea with leukocytosis and abdominal pain plan is to get CT scan to make sure there is no acute surgical issue.    [RR]   1818 CT scan read by radiologist.  No signs of appendicitis.  No definitive diverticulitis.  There is diverticulosis noted.  No free fluid or perform or bowel obstruction.    [RR]   1829 Patient seen again at 6:25 p.m.Marland Kitchen  Sleeping.  He has had no vomiting here.  He states he still some cramping pain.  Will try some Tylenol and Bentyl.    Will let him eat and then I will reassess.      [RR]   1923 Patient seen again at 7:20 p.m..  He tolerated a meal without vomiting.  He had a formed stool in the bedside commode that I examined.  There is no diarrhea.  He will be discharged home and he is feeling better after Tylenol and Bentyl.  He agrees with the plan.    [RR]      ED Course User Index  [RR] Marland Kitchen, MD       Procedures      NIH Stroke Scale

## 2018-12-26 NOTE — ED Notes (Signed)
Patient is alert and oriented x4, calm and cooperative. Patient provided discharge paperwork and educated on follow up recommendations, medications, and discharge instructions, patient/caregiver verbalized understanding. Vital signs stable. Patient, nurse, and MD in agreement of discharge plan. Patient ambulates from the ED safely and willingly.     Patient provided taxi voucher, taxi called for patient.

## 2018-12-26 NOTE — Telephone Encounter (Signed)
Pt name and DOB verified.    Pt is returning the nurses call. Pt wanted to let the office know that he is currently in the hospital.    [phone] (home)     Hillary Bow

## 2018-12-26 NOTE — ED Notes (Addendum)
Patient is alert and oriented x4, calm and cooperative. Patient provided discharge paperwork and educated on follow up recommendations, medications, and discharge instructions, patient/caregiver verbalized understanding. Vital signs stable. Patient, nurse, and MD in agreement of discharge plan. Patient ambulates from the ED safely and willingly.     Patient provided taxi voucher, taxi called for patient.

## 2018-12-26 NOTE — Telephone Encounter (Signed)
LM for pt to CB.  Please transfer his call. Thanks.

## 2018-12-26 NOTE — Telephone Encounter (Signed)
Noted. Thank you.

## 2018-12-26 NOTE — Telephone Encounter (Signed)
COVID-19 CONCERN: Requesting Swab Testing    Patient name & DOB Verified.    Does patient have a PCP? YES If yes, who? Robin Dobrinick   (If within [facility] ??? please send this note to PCP nurse pool as a High Priority they will send pt to Urgent care of the [facility] swab site. If patient of [facility] or [facility], attempt to call, if no answer send note to [facility])    Have you been in close contact with someone known to have or is under investigation for COVID-19? NO    Have you had any of the following symptoms? YES  - Fever  - Shortness of Breath  - Cough    (send note to Rodman [facility] at [facility] only for [facility] patients. Inform caller that it could take 24 hours before they get a call back due to a high increase of requests & supply the caller with Swab Clinic phone number [phone]. If symptoms are severe they will get a call back from a nurse that day)    AGENT NOTES:     Did agent attempt to reach office for warm transfer? NO     Did agent give caller phone number to call? NO     Did agent inform patient it could be 24 hours before they get a call back? YES     Did agent provider patient with other swab site contact number? YES     Comments: Pt started having a upset stomach last night. Pt denies fever. Pt stated he has a cough, runny nose and feels a little congested. Pt has diarrhea and feels nauseous.     CB#: [phone] (home)     [place]

## 2018-12-26 NOTE — ED Triage Notes (Signed)
Homeless man presenting for evaluation of nausea and diarrhea after eating spaghetti a roll and drinking coffee around 1830 last evening. States has been having progressive weakness prior to this episode. States has not taken any medications in at least 2 months. Does smoke but denies alcohol or drug use. Pt is hard of hearing. States does not taste or smell well but this is not not for him.

## 2018-12-26 NOTE — ED Provider Notes (Signed)
HPI chief complaint:  Diarrhea, nausea.  [age] year patient.  Homeless.  Lives in his camper.  A friend dropped off some spaghetti with meatballs last night and he ate the meal.  Soon after eating at he developed nausea and then diarrhea.  He estimates more than 10 episodes of loose stool.  Denies any bloody or black stool.  He has nausea and dry heaves.  He denies any significant vomiting.  He has no prior history of any abdominal surgery.  He does have history of diverticulitis but no pancreatitis.  He has no history of gallstones.  No fever.  No known coronavirus exposure that he is aware.  No cough or chest pain or shortness of breath.  He states his urine output appears dark in color.  He denies any hematuria or dysuria.  Denies any flank pain.  Some generalized abdominal pain.  He called his PCP and they advised him to come here.  He has is treating getting checked for coronavirus.  He has no known coronavirus exposure.  At 1 time was told he was prediabetic.  He has some blurry vision.  He has not checked his sugar recently.  He has some generalized weakness and headache.  No rash.  No stiff neck.  No recent alcohol.    Past Medical History:   Diagnosis Date   ??? Moderate episode of recurrent major depressive disorder ([place]) 03/22/2018   ??? Prediabetes 03/22/2018       Past Surgical History:   Procedure Laterality Date   ??? HX CARPAL TUNNEL RELEASE Right 2004   ??? HX ORTHOPAEDIC Right 02/2018    [address] off of ladder and shattered his right heel. Dr. Marcelline Deist at [place]         Family History:   Problem Relation Age of Onset   ??? Diabetes Mother    ??? Pancreatic Cancer Mother    ??? Melanoma Mother    ??? Diabetes Father    ??? Stroke Father         on blood thinners       Social History     Socioeconomic History   ??? Marital status: MARRIED     Spouse name: Not on file   ??? Number of children: Not on file   ??? Years of education: Not on file   ??? Highest education level: Not on file   Occupational History    ??? Not on file   Social Needs   ??? Financial resource strain: Not on file   ??? Food insecurity     Worry: Not on file     Inability: Not on file   ??? Transportation needs     Medical: Not on file     Non-medical: Not on file   Tobacco Use   ??? Smoking status: Current Every Day Smoker     Packs/day: 0.50     Years: 40.00     Pack years: 20.00     Types: Cigarettes     Start date: 37     Last attempt to quit: 03/05/2018     Years since quitting: 0.8   ??? Smokeless tobacco: Never Used   ??? Tobacco comment: 1.5 pack a day stopped on 03/05/18 resumed on 04/05/18   Substance and Sexual Activity   ??? Alcohol use: Not Currently     Frequency: Never     Drinks per session: 1 or 2     Binge frequency: Never   ??? Drug use:  Yes     Frequency: 7.0 times per week     Types: Marijuana   ??? Sexual activity: Yes     Partners: Female   Lifestyle   ??? Physical activity     Days per week: Not on file     Minutes per session: Not on file   ??? Stress: Not on file   Relationships   ??? Social Wellsite geologistconnections     Talks on phone: Not on file     Gets together: Not on file     Attends religious service: Not on file     Active member of club or organization: Not on file     Attends meetings of clubs or organizations: Not on file     Relationship status: Not on file   ??? Intimate partner violence     Fear of current or ex partner: Not on file     Emotionally abused: Not on file     Physically abused: Not on file     Forced sexual activity: Not on file   Other Topics Concern   ??? Not on file   Social History Narrative    Marital status: Married, Vernona RiegerLaura - 9 years    Living situation: Own home in [place]     Pets in the home: 2 dogs, 1 cat     Children: None- 2 step children     Occupation: Disabled    Highest education: HS    Alcohol,tobacco, drug ZOX:WRUEAVuse:smoker and marijuana use     Caffeine/ diet: Keto,  Decaf coffee    Exercise: None     Military- None                  ALLERGIES: Patient has no known allergies.    Review of Systems    Review of systems:  Please see HPI.  Otherwise reviewed and negative.    Vitals:    12/26/18 1212   BP: 139/70   Pulse: 89   Resp: 16   Temp: 98.2 ??F (36.8 ??C)   SpO2: 97%            Physical Exam General appearance:  Well-developed well-nourished patient  sitting up in bed.  He appears somewhat fatigued.  He is a fairly good historian.  Vital signs reviewed.  No fever.  No tachycardia.  No hypoxia.  Not coughing not vomiting in the ER on arrival.  HEENT exam:  Normocephalic atraumatic pupils equally round and reactive to light.  Extraocular muscles intact. Pharynx exam:  No erythema.  No exudate. No trismus.  Neck exam:  Supple, full range of motion.  Lung exam:  Clear to auscultation. No rhonchi.  No crackles.  No retractions.  No wheezes.  Heart exam:  Regular rate rhythm. No murmurs  Abdominal exam:  Nondistended.  Some mild epigastric tenderness.  No right lower quadrant tenderness.  No right upper quadrant tenderness.  No CVA tenderness.    Bowel sounds are present.  No guarding or rebound.  MSK exam:  Full range of motion all 4 extremities without pain. No visible cellulitis on the exposed skin.  Neurologic exam:  Awake and alert.  Follows commands.  Motor 5/5.  Sensation intact.  Grossly nonfocal.    MDM  Number of Diagnoses or Management Options      ICD-10-CM ICD-9-CM    1. Abdominal pain, generalized  R10.84 [phone]       58 year old patient here with acute GI illness mainly diarrhea.  No  bloody or black stool.  Some nausea and dry heaves.  May be related to a meal a friend gave him?  He has no fever and no respiratory complaints here.  He has some epigastric tenderness.  He has history of diverticulitis but no lower abdominal tenderness.  Plan to do a coronavirus swab as well as he is worried about that.  Because he is homeless and then do a rapid want have a result today.  Plan to give some intravenous fluids laboratory tests and medicate him.  I do not think he needs any imaging as of yet   ED Course as of Dec 25 1924   Tue Dec 26, 2018   1401 CBC shows leukocytosis of 15,000.  Hemoglobin is 17.7.  Platelet count 4 2 1  K.  Normal sodium, potassium, CO2.  Glucose is 196.  Normal GFR lipase.    [RR]   1432 Coronavirus negative.      [RR]   1550 Update:  Seen again at 3:45 p.m.  Awake and alert.  He has had no vomiting no diarrhea since he has been here.  He states he has got some pain.  On exam he has some right mid lateral abdominal pain.  He has a history of diverticulitis.  He denies any prior abdominal surgery.  Since he has had some nausea and diarrhea with leukocytosis and abdominal pain plan is to get CT scan to make sure there is no acute surgical issue.    [RR]   1818 CT scan read by radiologist.  No signs of appendicitis.  No definitive diverticulitis.  There is diverticulosis noted.  No free fluid or perform or bowel obstruction.    [RR]   1829 Patient seen again at 6:25 p.m.Marland Kitchen  Sleeping.  He has had no vomiting here.  He states he still some cramping pain.  Will try some Tylenol and Bentyl.    Will let him eat and then I will reassess.      [RR]   1923 Patient seen again at 7:20 p.m..  He tolerated a meal without vomiting.  He had a formed stool in the bedside commode that I examined.  There is no diarrhea.  He will be discharged home and he is feeling better after Tylenol and Bentyl.  He agrees with the plan.    [RR]      ED Course User Index  [RR] Marland Kitchen, MD       Procedures      NIH Stroke Scale

## 2018-12-26 NOTE — Progress Notes (Signed)
Admission: MED RECONCILIATION    Comments/Recommendations: This writer left medications that are still active and have refills remaining even though notes indicate patient is not taking any medications.    Medications added:     Hydroxyzine pamoate 25 mg cap    Medications removed:    Bupropion XL 150 mg tab  Doxepin 10 mg cap  Ibuprofen 200 mg tab  Triamcinolone 0.1% top cream    Medications adjusted:    N/A    Information obtained from: encounter notes, [facility] [place], [facility] - [place]          Allergies: Patient has no known allergies.    Prior to Admission Medications:     Medication Documentation Review Audit       Reviewed by Hagar, Sherry L, CPHT (Technician) on 12/26/18 at 1258      Medication Sig Documenting Provider Last Dose Status Taking?   gabapentin (NEURONTIN) 100 mg capsule Take 1 BID Gruen, Elliot J, DO Not Taking Unknown time Active No           Med Note (HAGAR, SHERRY L   Tue Dec 26, 2018 12:51 PM) Last dispensed 07/04/18 @ [facility] [place]. Picked up 07/10/18 for a 30 day supply # 60 with 1 refill remaining.   hydrOXYzine pamoate (VISTARIL) 25 mg capsule Take 25-50 mg by mouth nightly as needed for Anxiety or Sleep. Provider, Historical  Active Yes           Med Note (HAGAR, SHERRY L   Tue Dec 26, 2018 12:57 PM) Last dispensed / sold @ [facility] [place] 05/08/18 for a 30 day supply # 60 with refills remaining.   venlafaxine (EFFEXOR) 37.5 mg tablet Take 1 Tab by mouth three (3) times daily. Indications: major depressive disorder Dobrinick, Robin A, FNP Not Taking Unknown time Active No           Med Note (HAGAR, SHERRY L   Tue Dec 26, 2018 12:55 PM) Last dispensed/sold/picked up 10/13/18 @ [facility] [place] for a 6 day supply # 18. Total medication count # 702 remaining from a prescription submitted for 90 day supply # 270.                      Sherry L Hagar, CPHT   Contact: [phone]  Reviewed by Kara Mierzejewski, PharmD  Contact: [phone] or via [employer] [facility]

## 2018-12-27 MED ORDER — DICYCLOMINE 20 MG TAB
20 mg | ORAL_TABLET | Freq: Four times a day (QID) | ORAL | 0 refills | Status: DC
Start: 2018-12-27 — End: 2019-01-02

## 2019-01-02 ENCOUNTER — Ambulatory Visit: Admit: 2019-01-02 | Discharge: 2019-01-02 | Payer: MEDICARE | Attending: Family | Primary: Family

## 2019-01-02 ENCOUNTER — Ambulatory Visit: Attending: Family

## 2019-01-02 DIAGNOSIS — R7303 Prediabetes: Secondary | ICD-10-CM

## 2019-01-02 DIAGNOSIS — Z0000 Encounter for general adult medical examination without abnormal findings: Secondary | ICD-10-CM

## 2019-01-02 LAB — AMB POC HEMOGLOBIN A1C
Hemoglobin A1C, POC: 6.4 %
Hemoglobin A1c (POC): 6.4 %

## 2019-01-02 MED ORDER — SHINGRIX (PF) 50 MCG/0.5 ML INTRAMUSCULAR SUSPENSION, KIT
50 mcg/0.5 mL | INTRAMUSCULAR | 1 refills | Status: DC
Start: 2019-01-02 — End: 2019-01-02

## 2019-01-02 MED ORDER — SHINGRIX (PF) 50 MCG/0.5 ML INTRAMUSCULAR SUSPENSION, KIT
50 mcg/0.5 mL | INTRAMUSCULAR | 1 refills | Status: AC
Start: 2019-01-02 — End: ?

## 2019-01-02 MED ORDER — BOOSTRIX TDAP 2.5 LF UNIT-8 MCG-5 LF/0.5 ML INTRAMUSCULAR SYRINGE
Freq: Once | INTRAMUSCULAR | 0 refills | Status: AC
Start: 2019-01-02 — End: 2019-01-02

## 2019-01-02 MED ORDER — GABAPENTIN 100 MG CAP
100 mg | ORAL_CAPSULE | ORAL | 1 refills | Status: AC
Start: 2019-01-02 — End: ?

## 2019-01-02 MED ORDER — BOOSTRIX TDAP 2.5 LF UNIT-8 MCG-5 LF/0.5 ML INTRAMUSCULAR SYRINGE
Freq: Once | INTRAMUSCULAR | 0 refills | Status: DC
Start: 2019-01-02 — End: 2019-01-02

## 2019-01-02 MED ORDER — VENLAFAXINE 37.5 MG TAB
37.5 mg | ORAL_TABLET | Freq: Three times a day (TID) | ORAL | 3 refills | Status: DC
Start: 2019-01-02 — End: 2020-04-09

## 2019-01-02 NOTE — Progress Notes (Signed)
[name]   [address]  [place] [address]  [phone]    MEDICARE ANNUAL WELLNESS VISIT - SUBSEQUENT      CHIEF COMPLAINT   Stephen Meadows is a 58 y.o. male who presents today for Annual Wellness Visit.  HISTORY OF PRESENT ILLNESS     I had the pleasure of seeing Stephen Meadows today. He is a pleasant 40 year old caucasian male with a history of intermittent explosive disorder, depression and complex regional pain syndrome. He presents for his annual exam.     Unfortunately Stephen Meadows has been going through some very difficult times since I saw him last. His wife kicked him out and divorced him July. He thought he would be able to move [place] to live with his father and siblings. He worked with our Child psychotherapist, [name], who helped him access funds to have his truck fixed and purchase a generator to use in his camper. Unfortunately. He got half-way to [place] before his family contacted him to tell him not to come. It hurt him that they wouldn't take him in. He says he has no one to turn to.    He is presently living in a camper which is parked in the [place] parking lot.  He uses his generator and propane heater sparingly when it is very cold, but doesn't have much money for fuel. 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 He is over income for food stamps. He has not tried to access food pantries, but has been going to church and the congregation is helping to keep him fed. He has no running water and nowhere to bathe. He has called a few homeless shelters, but they are full and regardless, he won't leave his truck or camper behind. He says he owes more on both then he could sell them for.    He hasn't seen his psychiatrist since June and missed his appointment for a neuropsychiatric evaluation. (His ex-wife had expressed concern that he might be on the spectrum and she  and Nyan had requested this referral months ago). He is no longer taking Effexor and never picked up the gabapentin that Dr. Joanne Gavel had prescribed when he saw him in June because he can't afford either. He does not qualify for behavioral case management as he does not have full medicaid.     Pre-Diabetes:  He denies having polyuria, polydipsia or polyphagia.  He is not checking blood sugars at home.      Lab Results   Component Value Date/Time    Hemoglobin A1c (POC) 6.4 01/02/2019 01:33 PM     MEDICATIONS     Current Outpatient Medications   Medication Sig   ??? Shingrix, PF, 50 mcg/0.5 mL susr injection Administer 0.82ml IM injection once now and a second injection in 2-6 months  Indications: shingles vaccination   ??? venlafaxine (EFFEXOR) 37.5 mg tablet Take 1 Tab by mouth three (3) times daily. Indications: major depressive disorder   ??? gabapentin (NEURONTIN) 100 mg capsule Take 1 BID     No current facility-administered medications for this visit.      Medications Discontinued During This Encounter   Medication Reason   ??? diphth, pertus,acell,, tetanus (Boostrix Tdap) 2.5-8-5 Lf-mcg-Lf/0.81mL syrg REORDER   ??? Shingrix, PF, 50 mcg/0.5 mL susr injection REORDER   ??? hydrOXYzine pamoate (VISTARIL) 25 mg capsule  Side Effects   ??? dicyclomine (BENTYL) 20 mg tablet Other   ??? venlafaxine (EFFEXOR) 37.5 mg tablet REORDER   ??? gabapentin (NEURONTIN) 100 mg capsule REORDER       ALLERGIES   No Known Allergies    ACTIVE MEDICAL PROBLEMS     Patient Active Problem List   Diagnosis Code   ??? Moderate episode of recurrent major depressive disorder ([place]) F33.1   ??? Prediabetes R73.03   ??? 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 Nocturia R35.1   ??? Anxiety F41.9   ??? 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 At moderate risk for fall Z91.81   ??? Inadequate material resources Z59.8   ??? Intermittent explosive disorder in adult F63.81   ??? Preventative health care Z00.00       PAST SURGICAL HISTORY     Past Surgical History:    Procedure Laterality Date   ??? HX CARPAL TUNNEL RELEASE Right 2004   ??? HX ORTHOPAEDIC Right 02/2018    [address] off of ladder and shattered his right heel. Dr. Marcelline Deist at [place]   ??? HX OTHER SURGICAL  2014    Spinal cord stimulator       SOCIAL HISTORY     Social History     Social History Narrative    Marital status: Married, Mickel Baas - 9 years    Living situation:     Pets in the home: 2 dogs, 1 cat     Children: None- 2 step children     Occupation: Disabled    Highest education: HS    Alcohol,tobacco, drug YQM:VHQION and marijuana use     Caffeine/ diet: Keto,  Decaf coffee    Exercise: None     Military- None                FAMILY HISTORY     Family History   Problem Relation Age of Onset   ??? Diabetes Mother    ??? Pancreatic Cancer Mother    ??? Melanoma Mother    ??? Diabetes Father    ??? Stroke Father         on blood thinners       VITALS     Vitals:    01/02/19 1317   BP: 100/60   Pulse: 80   Temp: 98.9 ??F (37.2 ??C)   Weight: 173 lb (78.5 kg)   Height: 5\' 9"  (1.753 m)     Body mass index is 25.55 kg/m??.    BP Readings from Last 3 Encounters:   01/02/19 100/60   12/26/18 109/72   03/22/18 110/84     Wt Readings from Last 3 Encounters:   01/02/19 173 lb (78.5 kg)   03/22/18 188 lb 12.8 oz (85.6 kg)       PHYSICAL EXAM     General:  Alert and oriented x4 with lucid speech, no acute distress.   Head: Normocephalic, atraumatic.  Eyes:  Sclerae clear, conjunctiva without injection, PERRLA, EOMI, with fundi not visualized.  Ears: Tympanic membranes are non-erythematous and in the neutral position, EAM without erythema or discharge  Nose: Nares patent with no discharge. Sinuses with no tenderness to percussion.  Oropharynx: Exam deferred due to standard precautions to prevent the spread of COVID19   Neck: Supple. No thyromegaly or lymphadenopathy.  Lungs:  Clear to auscultation bilaterally.   Cardiovascular:  Regular rate/rhythm with normal S1-S2 and no murmurs.  Abdomen/GI: Soft, non-tender with normoactive  bowel  sounds x4. No hepatosplenomegaly, masses, pulsations, or guarding.   Genitourinary:  No CVA tenderness. No suprapubic tenderness.  Musculoskeletal:  No joint swelling, tenderness, or limited range of motion. Gait is non-antalgic. Transfers from sitting to standing to walking with ease  Extremities: Good circulation, no edema.  Skin and Nails:  Pitting and subungual hyperkeratosis of multiple fingernails on both hands.   Neurological:  Cranial nerves 2-11 are intact by inspection with no focal deficits.  Psych/Mental Status:  Calm, alert and cooperative. Mood and affect appropriate.    WELLNESS EVALUATION & HEALTH RISK ASSESSMENT     DEPRESSION SCREENING  Severity:  1-4 low risk; 5-9 mild depressive symptoms; 10-14 mild major depression; 15-19 moderate major depression; 20-27 severe major depression  3 most recent PHQ Screens 01/02/2019   Little interest or pleasure in doing things Nearly every day   Feeling down, depressed, irritable, or hopeless Not at all   Total Score PHQ 2 3   Trouble falling or staying asleep, or sleeping too much Several days   Feeling tired or having little energy Several days   Poor appetite, weight loss, or overeating More than half the days   Feeling bad about yourself - or that you are a failure or have let yourself or your family down Not at all   Trouble concentrating on things such as school, work, reading, or watching TV Not at all   Moving or speaking so slowly that other people could have noticed; or the opposite being so fidgety that others notice Several days   Thoughts of being better off dead, or hurting yourself in some way Not at all   PHQ 9 Score 8   How difficult have these problems made it for you to do your work, take care of your home and get along with others Not difficult at all     C-SSRS [place] Suicide Severity Rating Scale 03/22/2018 12/26/2018   1) Within the past month, have you wished you were dead or wished you could go to sleep and not wake up?  No No    2) Have you actually had any thoughts of killing yourself? No No   6) Have you ever done anything, started to do anything, or prepared to do anything to end your life? No No       FALL RISK SCREENING  Fall Risk Assessment, last 12 mths 01/02/2019   Able to walk? Yes   Fall in past 12 months? 1   Do you feel unsteady? 0   Are you worried about falling 0   Is TUG test greater than 12 seconds? 1   Is the gait abnormal? 1   Number of falls in past 12 months 1   Fall with injury? 0       GENERAL  What matters most to you?: Staying warm  In general, how would you say your health is?: Fair  Do you have a Living Will?: (!) No  Would you like more information about or help completing a Living Will?: No    HEALTH HABITS AND NUTRITION  Do you worry whether your food will run out before you have money to buy more?: (!) Yes  Have you lost any weight without trying in the past 3 months?: (!) Yes    HEARING/VISION/SKIN  Do you or your family notice any trouble with your hearing?: (!) Yes  Do you have difficulty driving, watching TV, or doing any of your daily activities because of your eyesight?:  No  Do you have any skin concerns?: No    SLEEP/MEMORY/ANXIETY  Do you have concerns about your sleep?: No  Do you often feel tired, fatigued, or sleepy during the daytime, even after a "good" night's sleep? : No  Do you or a family member have concerns about your memory?: No  Over the last several months, have you been continually worried or anxious about a number of events or activities in your daily life? : (!) Yes    SUBSTANCE AND OPIOID USE  Do you currently drink alcohol?: No  Have you ever felt the need to cut down on your drinking or drug use?: No  Have people annoyed you by criticizing your drinking or drug use?: No  Have you ever felt guilty about drinking or drug use?: No  Have you ever had a drink or used drugs first thing in the morning to steady your nerves or to get rid of a hangover (eye-opener)? : No  CAGE AID Score -  >= 2 is considered clinically significant: 0  In the past year, have you used street drugs or prescription medications not prescribed to you?: No  Are you currently using a prescription opioid pain medication such as oxycodone, tramadol, hydrocodone or morphine?: No    SAFETY  Does your home have throw rugs, poor lighting, a slippery bathtub or shower, or clutter in the hallways?: No  Do all of your stairways have a railing or banister?: (!) No(No stairs)  Do you have difficulty with balance or walking?: (!) Yes    ADLS  In the past 7 days, did you need help from others to perform any of the following everyday activities?: None  In the past 7 days, did you need help from others to take care of any of the following?: None    PHYSICAL ACTIVITY  Are You Physically Active: No  Do You Have Difficulty Exercising: No     TIMED UP AND GO TEST (If indicated)  Timed Up and Go TUG Score: (!) Time 12 seconds or greater  Timed Up and Go TUG Observations: (!) Short strides    MINI-COG SCORE (If indicated)       STOP-BANG SCORE (If indicated)       PREVENTIVE CARE - SCREENINGS     IMMUNIZATIONS  Immunization History   Administered Date(s) Administered   ??? Influenza Vaccine [employer]) PF (>6 Mo Flulaval, Fluarix, and >3 Yrs Afluria, Fluzone 16109) 03/22/2018, 01/02/2019   ??? Pneumococcal Polysaccharide (PPSV-23) 01/02/2019     ?? Pneumococcal Vaccine:    ?? If <65:  (PPSV23 (Pneumovax) once for high risk patients - Smokers, Alcoholics, Diabetics, Chronic Lung, Heart, Kidney or Liver Dz)  ??  Given 01/07/19  ?? Influenza Vaccine: Discussed annual vaccination. Administered today.  ?? Shingles Vaccine:  (Shingrix for those over 50, 2 shots 2-6 months apart)  ?? Shingrix:  Shingrix Rx was sent to pharmacy 01/07/2019  ?? TdaP (every 10 yrs)  ?? Action:  Sent to Pharmacy 01/07/2019    Health Maintenance Topics with due status: Overdue       Topic Date Due    Hepatitis C Screening 12-24-1960    Colorectal Cancer Screening Combo 07/13/2012     Health  Maintenance Topics with due status: Not Due       Topic Last Completion Date    Medicare Yearly Exam 01/02/2019    A1C test (Diabetic or Prediabetic) 01/02/2019     Health Maintenance Topics with due status: Completed  Topic Last Completion Date    Flu Vaccine 01/02/2019    Pneumococcal 0-64 years 01/02/2019     ?? Colon CA Screening:    ?? Colonoscopy (Q10Y), FIT test (Q1Y), or Cologuard (Q3Y) from 50-75 with consideration of ongoing screening from 76-85.    ?? Last result:  Colonoscopy:  11/24/2017  ?? Next due:  10 years- Diverticulosis  ?? Action:  Up to date as of 01/07/19  ?? Abdominal Aortic Aneurysm screening:    ?? Indicated for patients over 48 with a FH of AAA or ever smoked >100 cigarettes.    ?? Not indicated   ?? Lung CA screening with Low dose CT:    ?? (55-77 y.o., 30 PYH, and currently smoking or quit less than 15 years ago, Annually if at risk).    ?? Tobacco Hx:  Current smoker - PYH: 21.5  ?? Baseline:  Not indicated  ?? Action:  Not indicated  ?? PSA screening:   ?? Every 2-4 years for patients age 51-69 after review of pros and cons.    ?? Ordered 01/02/2019  ?? Hepatitis C screening:  (once for 18-79 y.o., annual for high risk)  Ordered 01/07/2019  No results found for: HCGA    WELCOME TO MEDICARE (ONLY)     EKG (K9326)  EKG order placed: No    VISION SCREENING  No exam data present    INDIVIDUALIZED SCREENING, EDUCATION, AND PLAN     The patient isn't on high risk medication(s) including benzodiaepines.    Based on my evaluation of the patient and the Health Risk Assessment performed today, there is  evidence of cognitive impairment.    EDUCATION & COUNSELING PROVIDED (based on today's review & evaluation)  ?? Healthy diet, maintaining a healthy weight, and getting at least 30 minutes of exercise most every day.    ?? End-of-Life planning as needed.    ?? Cholesterol screening:  (every 5 years after age 56)   ?? Diabetes screening: (40 to 70 who are overweight or obese)   ?? Aspirin therapy reviewed including  risks and benefits.  ?? Reviewed safe amounts of alcohol use (No more than 2 drinks per day).      Medicare Screening Schedule Reviewed and Handout with detailed personalized recommendations was given to the patient.    PATIENT CARE TEAM  Patient Care Team:  Lorenso Quirino, Jamison Neighbor, FNP as PCP - General (Family Medicine)  Gruen, Lynann Bologna, DO as Physician (Psychiatry)  [name] as Care Manager  [name] as Care Manager      ASSESSMENT AND PLAN      Diagnoses and all orders for this visit:    1. Preventative health care  Assessment & Plan:  ?? Discussed the importance of regular aerobic exercise and eating a healthy diet.  ?? Discussed age-appropriate routine health maintenance. Fasting lipid and metabolic profiles ordered along with PSA and HCV screening. Follow-up three months.   ?? Immunizations: Influenza and Pneumovax administered today. Shingrix and Tdap sent to pharmacy.   ?? Follow-up in one year.    Orders:  -     REFERRAL FOR COLONOSCOPY  -     PSA SCREENING (SCREENING); Future  -     HCV AB; Future    2. Intermittent explosive disorder in adult  Assessment & Plan:  Patient is no longer taking Effexor and was never able to start gabapentin due to financial stressors. I provided him with a Good Rx card today and we found  that both medications could be filled for less than a total $25 per month, which he feels he'll be able to afford. I recommended continued follow-up with psychiatry and will reach out to [facility] on his behalf to as that they call him to schedule with Dr. Joanne Gavel.     Orders:  -     venlafaxine (EFFEXOR) 37.5 mg tablet; Take 1 Tab by mouth three (3) times daily. Indications: major depressive disorder  -     gabapentin (NEURONTIN) 100 mg capsule; Take 1 BID  -     REFERRAL TO [facility]    3. Moderate episode of recurrent major depressive disorder [facility])  Assessment & Plan:  Resume treatment with Effexor. Recommended follow-up with psychiatry.     Orders:  -     venlafaxine (EFFEXOR)  37.5 mg tablet; Take 1 Tab by mouth three (3) times daily. Indications: major depressive disorder  -     REFERRAL TO [facility]    4. Anxiety  Assessment & Plan:  Resume treatment with Effexor and start gabapentin as recommended by Dr. Joanne Gavel. Good Rx coupon provided. Recommended follow-up with psychiatry.     Orders:  -     gabapentin (NEURONTIN) 100 mg capsule; Take 1 BID  -     REFERRAL TO [facility]    5. Prediabetes  Assessment & Plan:  ?? We discussed that pre-diabetes is a precursor to Diabetes and it's very important to keep the weight down in order to prevent it's progression.  ?? 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 He eats meals provided by his church community. We discussed the importance of avoiding sodas and sweets, but he will not have much capacity to make choices or changes to his diet right now.   ?? A1c was reviewed with the patient and there is no need for medication at this time.  ?? Repeat Glycohemoglobin and follow-up in 3 months.      Orders:  -     AMB POC HEMOGLOBIN A1C    6. Psoriasis-like skin disease  Assessment & Plan:  Pitting of fingernails suggestive of psoriasis. Not addressed today given more pressing issues addressed.       7. At moderate risk for fall  Assessment & Plan:  Discussed PT for balance and gait training. Patient is homeless and declines for now.      8. Inadequate material resources  Assessment & Plan:  Reached out to Child psychotherapist. Unfortunately he is over income for food stamps and has exhausted one-time funds available through [facility] which he used to fix his truck. He owes too much on his truck and camper to sell either. He is unwilling to leave them to go to a homeless shelter and therefore has nowhere to bath. His SSDI income is barely sufficient to pay his bills. His church community is providing him with meals. He does not qualify for behavioral case management as he does not have full medicaid. I  will try to refer him for CM services through [employer] as he voiced a willingness for this. I provided him with a list of food pantries and homeless shelters and encouraged him to call. Follow-up three months.       9. Encounter for screening for malignant neoplasm of prostate   -     PSA SCREENING (SCREENING); Future    10. Encounter for immunization  -  INFLUENZA VIRUS VAC QUAD,SPLIT,PRESV FREE SYRINGE IM  -     PNEUMOCOCCAL POLYSACCHARIDE VACCINE, 23-VALENT, ADULT OR IMMUNOSUPPRESSED PT DOSE,    Other orders  -     diphth, pertus,acell,, tetanus (Boostrix Tdap) 2.5-8-5 Lf-mcg-Lf/0.73mL syrg; 0.5 mL by IntraMUSCular route once for 1 dose. Indications: vaccination to prevent diphtheria, pertussis and tetanus  -     Shingrix, PF, 50 mcg/0.5 mL susr injection; Administer 0.43ml IM injection once now and a second injection in 2-6 months  Indications: shingles vaccination      Follow-up and Dispositions    ?? Return in 3 months (on 04/02/2019) for *Please schedule AWV in one year.         Future Appointments   Date Time Provider Department Center   04/03/2019 11:00 AM Ponciano Shealy, Jamison Neighbor, FNP [facility]   01/03/2020  1:30 PM Fortune Torosian, Jamison Neighbor, FNP [facility]       Jamison Neighbor Marja Adderley, FNP  01/02/2019

## 2019-01-02 NOTE — Progress Notes (Signed)
[facility]   [address] [place]  [place] [id number]  [phone]    MEDICARE ANNUAL WELLNESS VISIT - SUBSEQUENT      CHIEF COMPLAINT   Stephen Meadows is a 58 y.o. male who presents today for Annual Wellness Visit.  HISTORY OF PRESENT ILLNESS     I had the pleasure of seeing Stephen Meadows today. He is a pleasant 58 year old caucasian male with a history of intermittent explosive disorder, depression and complex regional pain syndrome. He presents for his annual exam.     Unfortunately Stephen Meadows has been going through some very difficult times since I saw him last. His wife kicked him out and divorced him July. He [place] would be able to [place] live with his father and siblings. He worked with our Child psychotherapistsocial worker, Stephen Meadows, who helped him access funds to have his truck fixed and purchase a generator to use in his camper. Unfortunately. [place] got [place] before his family contacted him to tell him not to come. It hurt him that they wouldn't take him in. He says he has no one to turn to.    He is presently living in a camper which is parked in the [employer] parking lot.  He uses his generator and propane heater sparingly when it is very cold, but doesn't have much money for fuel. He has SSDI Income but has not been able to stretch it far as he owes on his truck, Electronics engineercamper, insurance, and cellular phone. He is over income for food stamps. He has not tried to access food pantries, but has been going to church and the congregation is helping to keep him fed. He has no running water and nowhere to bathe. He has called a few homeless shelters, but they are full and regardless, he won't leave his truck or camper behind. He says he owes more on both then he could sell them for.     He hasn't seen his psychiatrist since June and missed his appointment for a neuropsychiatric evaluation. (His ex-wife had expressed concern that he might be on the spectrum and she and Stephen Meadows had requested this referral months ago). He is no longer taking Effexor and never picked up the gabapentin that Stephen Meadows had prescribed when he saw him in June because he can't afford either. He does not qualify for behavioral case management as he does not have full medicaid.     Pre-Diabetes:  He denies having polyuria, polydipsia or polyphagia.  He is not checking blood sugars at home.      Lab Results   Component Value Date/Time    Hemoglobin A1c (POC) 6.4 01/02/2019 01:33 PM     MEDICATIONS     Current Outpatient Medications   Medication Sig   ??? Shingrix, PF, 50 mcg/0.5 mL susr injection Administer 0.645ml IM injection once now and a second injection in 2-6 months  Indications: shingles vaccination   ??? venlafaxine (EFFEXOR) 37.5 mg tablet Take 1 Tab by mouth three (3) times daily. Indications: major depressive disorder   ??? gabapentin (NEURONTIN) 100 mg capsule Take 1 BID     No current facility-administered medications for this visit.      Medications Discontinued During This Encounter   Medication Reason   ??? diphth, pertus,acell,, tetanus (Boostrix Tdap) 2.5-8-5 Lf-mcg-Lf/0.135mL syrg REORDER   ??? Shingrix, PF, 50 mcg/0.5 mL susr injection REORDER   ??? hydrOXYzine pamoate (VISTARIL) 25 mg capsule  Side Effects   ??? dicyclomine (BENTYL) 20 mg tablet Other   ??? venlafaxine (EFFEXOR) 37.5 mg tablet REORDER   ??? gabapentin (NEURONTIN) 100 mg capsule REORDER       ALLERGIES   No Known Allergies    ACTIVE MEDICAL PROBLEMS     Patient Active Problem List   Diagnosis Code   ??? Moderate episode of recurrent major depressive disorder (HCC) F33.1   ??? Prediabetes R73.03   ??? 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 Nocturia R35.1   ??? Anxiety F41.9   ??? 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 At moderate risk for fall Z91.81    ??? Inadequate material resources Z59.8   ??? Intermittent explosive disorder in adult F63.81   ??? Preventative health care Z00.00       PAST SURGICAL HISTORY     Past Surgical History:   Procedure Laterality Date   ??? HX CARPAL TUNNEL RELEASE Right 2004   ??? HX ORTHOPAEDIC Right 02/2018    Larey Seat off of ladder and shattered his right heel. Dr. Elijah [facility] at [facility]   ??? HX OTHER SURGICAL  2014    Spinal cord stimulator       SOCIAL HISTORY     Social History     Social History Narrative    Marital status: Married, Stephen Meadows - 9 years    Living situation:     Pets in the home: 2 dogs, 1 cat     Children: None- 2 step children     Occupation: Disabled    Highest education: HS    Alcohol,tobacco, drug [id number] and marijuana use     Caffeine/ diet: Keto,  Decaf coffee    Exercise: None     Military- None                FAMILY HISTORY     Family History   Problem Relation Age of Onset   ??? Diabetes Mother    ??? Pancreatic Cancer Mother    ??? Melanoma Mother    ??? Diabetes Father    ??? Stroke Father         on blood thinners       VITALS     Vitals:    01/02/19 1317   BP: 100/60   Pulse: 80   Temp: 98.9 ??F (37.2 ??C)   Weight: 173 lb (78.5 kg)   Height:  (1.753 m)     Body mass index is 25.55 kg/m??.    BP Readings from Last 3 Encounters:   01/02/19 100/60   12/26/18 109/72   03/22/18 110/84     Wt Readings from Last 3 Encounters:   01/02/19 173 lb (78.5 kg)   03/22/18 188 lb 12.8 oz (85.6 kg)       PHYSICAL EXAM     General:  Alert and oriented x4 with lucid speech, no acute distress.   Head: Normocephalic, atraumatic.  Eyes:  Sclerae clear, conjunctiva without injection, PERRLA, EOMI, with fundi not visualized.  Ears: Tympanic membranes are non-erythematous and in the neutral position, EAM without erythema or discharge  Nose: Nares patent with no discharge. Sinuses with no tenderness to percussion.  Oropharynx: Exam deferred due to standard precautions to prevent the spread of COVID19    Neck: Supple. No thyromegaly or lymphadenopathy.  Lungs:  Clear to auscultation bilaterally.   Cardiovascular:  Regular rate/rhythm with normal S1-S2 and no murmurs.  Abdomen/GI: Soft, non-tender with normoactive bowel  sounds x4. No hepatosplenomegaly, masses, pulsations, or guarding.   Genitourinary:  No CVA tenderness. No suprapubic tenderness.  Musculoskeletal:  No joint swelling, tenderness, or limited range of motion. Gait is non-antalgic. Transfers from sitting to standing to walking with ease  Extremities: Good circulation, no edema.  Skin and Nails:  Pitting and subungual hyperkeratosis of multiple fingernails on both hands.   Neurological:  Cranial nerves 2-11 are intact by inspection with no focal deficits.  Psych/Mental Status:  Calm, alert and cooperative. Mood and affect appropriate.    WELLNESS EVALUATION & HEALTH RISK ASSESSMENT     DEPRESSION SCREENING  Severity:  1-4 low risk; 5-9 mild depressive symptoms; 10-14 mild major depression; 15-19 moderate major depression; 20-27 severe major depression  3 most recent PHQ Screens 01/02/2019   Little interest or pleasure in doing things Nearly every day   Feeling down, depressed, irritable, or hopeless Not at all   Total Score PHQ 2 3   Trouble falling or staying asleep, or sleeping too much Several days   Feeling tired or having little energy Several days   Poor appetite, weight loss, or overeating More than half the days   Feeling bad about yourself - or that you are a failure or have let yourself or your family down Not at all   Trouble concentrating on things such as school, work, reading, or watching TV Not at all   Moving or speaking so slowly that other people could have noticed; or the opposite being so fidgety that others notice Several days   Thoughts of being better off dead, or hurting yourself in some [place] Not at all   PHQ 9 Score 8    How difficult have these problems made it for you to do your work, take care of your home and get along with others Not difficult at all     [place] 03/22/2018 12/26/2018   1) Within the past month, have you wished you were dead or wished you could go to sleep and not wake up?  No No   2) Have you actually had any thoughts of killing yourself? No No   6) Have you ever done anything, started to do anything, or prepared to do anything to end your life? No No       FALL RISK SCREENING  Fall Risk Assessment, last 12 mths 01/02/2019   Able to walk? Yes   Fall in past 12 months? 1   Do you feel unsteady? 0   Are you worried about falling 0   Is TUG test greater than 12 seconds? 1   Is the gait abnormal? 1   Number of falls in past 12 months 1   Fall with injury? 0       GENERAL  What matters most to you?: Staying warm  In general, how would you say your health is?: Fair  Do you have a Living Will?: (!) No  Would you like more information about or help completing a Living Will?: No    HEALTH HABITS AND NUTRITION  Do you worry whether your food will run out before you have money to buy more?: (!) Yes  Have you lost any weight without trying in the past 3 months?: (!) Yes    HEARING/VISION/SKIN  Do you or your family notice any trouble with your hearing?: (!) Yes  Do you have difficulty driving, watching TV, or doing any of your daily activities because of your eyesight?:  No  Do you have any skin concerns?: No    SLEEP/MEMORY/ANXIETY  Do you have concerns about your sleep?: No  Do you often feel tired, fatigued, or sleepy during the daytime, even after a "good" night's sleep? : No  Do you or a family member have concerns about your memory?: No  Over the last several months, have you been continually worried or anxious about a number of events or activities in your daily life? : (!) Yes    SUBSTANCE AND OPIOID USE  Do you currently drink alcohol?: No   Have you ever felt the need to cut down on your drinking or drug use?: No  Have people annoyed you by criticizing your drinking or drug use?: No  Have you ever felt guilty about drinking or drug use?: No  Have you ever had a drink or used drugs first thing in the morning to steady your nerves or to get rid of a hangover (eye-opener)? : No  CAGE AID Score - >= 2 is considered clinically significant: 0  In the past year, have you used street drugs or prescription medications not prescribed to you?: No  Are you currently using a prescription opioid pain medication such as oxycodone, tramadol, hydrocodone or morphine?: No    SAFETY  Does your home have throw rugs, poor lighting, a slippery bathtub or shower, or clutter in the hallways?: No  Do all of your stairways have a railing or banister?: (!) No(No stairs)  Do you have difficulty with balance or walking?: (!) Yes    ADLS  In the past 7 days, did you need help from others to perform any of the following everyday activities?: None  In the past 7 days, did you need help from others to take care of any of the following?: None    PHYSICAL ACTIVITY  Are You Physically Active: No  Do You Have Difficulty Exercising: No     TIMED UP AND GO TEST (If indicated)  Timed Up and Go TUG Score: (!) Time 12 seconds or greater  Timed Up and Go TUG Observations: (!) Short strides    MINI-COG SCORE (If indicated)       STOP-BANG SCORE (If indicated)       PREVENTIVE CARE - SCREENINGS     IMMUNIZATIONS  Immunization History   Administered Date(s) Administered   ??? Influenza Vaccine [employer]) PF (>6 Mo Flulaval, Fluarix, and >3 Yrs Afluria, Fluzone 03159) 03/22/2018, 01/02/2019   ??? Pneumococcal Polysaccharide (PPSV-23) 01/02/2019     ?? Pneumococcal Vaccine:    ?? If <65:  (PPSV23 (Pneumovax) once for high risk patients - Smokers, Alcoholics, Diabetics, Chronic Lung, Heart, Kidney or Liver Dz)  ??  Given 01/07/19  ?? Influenza Vaccine: Discussed annual vaccination. Administered today.   ?? Shingles Vaccine:  (Shingrix for those over 50, 2 shots 2-6 months apart)  ?? Shingrix:  Shingrix Rx was sent to pharmacy 01/07/2019  ?? TdaP (every 10 yrs)  ?? Action:  Sent to Pharmacy 01/07/2019    Health Maintenance Topics with due status: Overdue       Topic Date Due    Hepatitis C Screening [birth year]    Colorectal Cancer Screening Combo 07/13/2012     Health Maintenance Topics with due status: Not Due       Topic Last Completion Date    Medicare Yearly Exam 01/02/2019    A1C test (Diabetic or Prediabetic) 01/02/2019     Health Maintenance Topics with due status: Completed  Topic Last Completion Date    Flu Vaccine 01/02/2019    Pneumococcal 0-64 years 01/02/2019     ?? Colon CA Screening:    ?? Colonoscopy (Q10Y), FIT test (Q1Y), or Cologuard (Q3Y) from 50-75 with consideration of ongoing screening from 76-85.    ?? Last result:  Colonoscopy:  11/24/2017  ?? Next due:  10 years- Diverticulosis  ?? Action:  Up to date as of 01/07/19  ?? Abdominal Aortic Aneurysm screening:    ?? Indicated for patients over 38 with a FH of AAA or ever smoked >100 cigarettes.    ?? Not indicated   ?? Lung CA screening with Low dose CT:    ?? (55-77 y.o., 30 PYH, and currently smoking or quit less than 15 years ago, Annually if at risk).    ?? Tobacco Hx:  Current smoker - PYH: 21.5  ?? Baseline:  Not indicated  ?? Action:  Not indicated  ?? PSA screening:   ?? Every 2-4 years for patients age 39-69 after review of pros and cons.    ?? Ordered 01/02/2019  ?? Hepatitis C screening:  (once for 18-79 y.o., annual for high risk)  Ordered 01/07/2019  No results found for: HCGA    WELCOME TO MEDICARE (ONLY)     EKG (Z6109)  EKG order placed: No    VISION SCREENING  No exam data present    INDIVIDUALIZED SCREENING, EDUCATION, AND PLAN     The patient isn't on high risk medication(s) including benzodiaepines.    Based on my evaluation of the patient and the Health Risk Assessment performed today, there is  evidence of cognitive impairment.     EDUCATION & COUNSELING PROVIDED (based on today's review & evaluation)  ?? Healthy diet, maintaining a healthy weight, and getting at least 30 minutes of exercise most every day.    ?? End-of-Life planning as needed.    ?? Cholesterol screening:  (every 5 years after age 51)   ?? Diabetes screening: (40 to 70 who are overweight or obese)   ?? Aspirin therapy reviewed including risks and benefits.  ?? Reviewed safe amounts of alcohol use (No more than 2 drinks per day).      Medicare Screening Schedule Reviewed and Handout with detailed personalized recommendations was given to the patient.    PATIENT CARE TEAM  Patient Care Team:  Andraya Frigon, Jam[place] Neighbor, FNP as PCP - General (Family Medicine)  Gruen, Lynann Bologna, DO as Physician (Psychiatry)  Stephen Mcardle as Care Manager  Stephen Mcardle as Care Manager      ASSESSMENT AND PLAN      Diagnoses and all orders for this visit:    1. Preventative health care  Assessment & Plan:  ?? Discussed the importance of regular aerobic exercise and eating a healthy diet.  ?? Discussed age-appropriate routine health maintenance. Fasting lipid and metabolic profiles ordered along with PSA and HCV screening. Follow-up three months.   ?? Immunizations: Influenza and Pneumovax administered today. Shingrix and Tdap sent to pharmacy.   ?? Follow-up in one year.    Orders:  -     REFERRAL FOR COLONOSCOPY  -     PSA SCREENING (SCREENING); Future  -     HCV AB; Future    2. Intermittent explosive disorder in adult  Assessment & Plan:  Patient is no longer taking Effexor and was never able to start gabapentin due to financial stressors. I provided him with a Good Rx card today and we found  that both medications could be filled for less than a total $25 per month, which he feels he'll be able to afford. I recommended continued follow-up with psychiatry and will reach out to [facility] on his behalf to as that they call him to schedule with Dr. Joanne Gavel.     Orders:   -     venlafaxine (EFFEXOR) 37.5 mg tablet; Take 1 Tab by mouth three (3) times daily. Indications: major depressive disorder  -     gabapentin (NEURONTIN) 100 mg capsule; Take 1 BID  -     REFERRAL TO [facility]    3. Moderate episode of recurrent major depressive disorder [facility])  Assessment & Plan:  Resume treatment with Effexor. Recommended follow-up with psychiatry.     Orders:  -     venlafaxine (EFFEXOR) 37.5 mg tablet; Take 1 Tab by mouth three (3) times daily. Indications: major depressive disorder  -     REFERRAL TO [facility]    4. Anxiety  Assessment & Plan:  Resume treatment with Effexor and start gabapentin as recommended by Dr. Joanne Gavel. Good Rx coupon provided. Recommended follow-up with psychiatry.     Orders:  -     gabapentin (NEURONTIN) 100 mg capsule; Take 1 BID  -     REFERRAL TO [facility]    5. Prediabetes  Assessment & Plan:  ?? We discussed that pre-diabetes is a precursor to Diabetes and it's very important to keep the weight down in order to prevent it's progression.  ?? 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 He eats meals provided by his church community. We discussed the importance of avoiding sodas and sweets, but he will not have much capacity to make choices or changes to his diet right now.   ?? A1c was reviewed with the patient and there is no need for medication at this time.  ?? Repeat Glycohemoglobin and follow-up in 3 months.      Orders:  -     AMB POC HEMOGLOBIN A1C    6. Psoriasis-like skin disease  Assessment & Plan:  Pitting of fingernails suggestive of psoriasis. Not addressed today given more pressing issues addressed.       7. At moderate risk for fall  Assessment & Plan:  Discussed PT for balance and gait training. Patient is homeless and declines for now.      8. Inadequate material resources  Assessment & Plan:   Reached out to Child psychotherapist. Unfortunately he is over income for food stamps and has exhausted one-time funds available through [facility] which he used to fix his truck. He owes too much on his truck and camper to sell either. He is unwilling to leave them to go to a homeless shelter and therefore has nowhere to bath. His SSDI income is barely sufficient to pay his bills. His church community is providing him with meals. He does not qualify for behavioral case management as he does not have full medicaid. I will try to refer him for CM services through [employer] as he voiced a willingness for this. I provided him with a list of food pantries and homeless shelters and encouraged him to call. Follow-up three months.       9. Encounter for screening for malignant neoplasm of prostate   -     PSA SCREENING (SCREENING); Future    10. Encounter for immunization  -  INFLUENZA VIRUS VAC QUAD,SPLIT,PRESV FREE SYRINGE IM  -     PNEUMOCOCCAL POLYSACCHARIDE VACCINE, 23-VALENT, ADULT OR IMMUNOSUPPRESSED PT DOSE,    Other orders  -     diphth, pertus,acell,, tetanus (Boostrix Tdap) 2.5-8-5 Lf-mcg-Lf/0.73mL syrg; 0.5 mL by IntraMUSCular route once for 1 dose. Indications: vaccination to prevent diphtheria, pertussis and tetanus  -     Shingrix, PF, 50 mcg/0.5 mL susr injection; Administer 0.43ml IM injection once now and a second injection in 2-6 months  Indications: shingles vaccination      Follow-up and Dispositions    ?? Return in 3 months (on 04/02/2019) for *Please schedule AWV in one year.         Future Appointments   Date Time Provider Department Center   04/03/2019 11:00 AM Sonali Wivell, [place] Neighbor, FNP [facility] [place]   01/03/2020  1:30 PM Daijah Scrivens, [place] Neighbor, FNP [facility] [place]       Jam[place] Neighbor Hobie Kohles, FNP  01/02/2019

## 2019-01-02 NOTE — Patient Instructions (Addendum)
I have sent both of your medications to [facility] on [address].  Use the Good Rx card to pick them up when you can afford them. The Effexor should be about $18 and the gabapentin about $7.    I have ordered your colonoscopy as well as some fasting blood work.   Someone will call you to schedule your colonoscopy.  Please have the blood work drawn at your earliest convenience.     Well Visit, Men 19 to 40: Care Instructions  Your Care Instructions     Physical exams can help you stay healthy. Your doctor has checked your overall health and may have suggested ways to take good care of yourself. He or she also may have recommended tests. At home, you can help prevent illness with healthy eating, regular exercise, and other steps.  Follow-up care is a key part of your treatment and safety. Be sure to make and go to all appointments, and call your doctor if you are having problems. It's also a good idea to know your test results and keep a list of the medicines you take.  How can you care for yourself at home?  ?? Reach and stay at a healthy weight. This will lower your risk for many problems, such as obesity, diabetes, heart disease, and high blood pressure.  ?? Get at least 30 minutes of exercise on most days of the week. Walking is a good choice. You also may want to do other activities, such as running, swimming, cycling, or playing tennis or team sports.  ?? Do not smoke. Smoking can make health problems worse. If you need help quitting, talk to your doctor about stop-smoking programs and medicines. These can increase your chances of quitting for good.  ?? Protect your skin from too much sun. When you're outdoors from 10 a.m. to 4 p.m., stay in the shade or cover up with clothing and a hat with a wide brim. Wear sunglasses that block UV rays. Even when it's cloudy, put broad-spectrum sunscreen (SPF 30 or higher) on any exposed skin.   ?? See a dentist one or two times a year for checkups and to have your teeth cleaned.  ?? Wear a seat belt in the car.  Follow your doctor's advice about when to have certain tests. These tests can spot problems early.  ?? Cholesterol. Your doctor will tell you how often to have this done based on your overall health and other things that can increase your risk for heart attack and stroke.  ?? Blood pressure. Have your blood pressure checked during a routine doctor visit. Your doctor will tell you how often to check your blood pressure based on your age, your blood pressure results, and other factors.  ?? Prostate exam. Talk to your doctor about whether you should have a blood test (called a PSA test) for prostate cancer. Experts recommend that you discuss the benefits and risks of the test with your doctor before you decide whether to have this test.  ?? Diabetes. Ask your doctor whether you should have tests for diabetes.  ?? Vision. Some experts recommend that you have yearly exams for glaucoma and other age-related eye problems starting at age 59.  ?? Hearing. Tell your doctor if you notice any change in your hearing. You can have tests to find out how well you hear.  ?? Colorectal cancer. Your risk for colorectal cancer gets higher as you get older. Some experts say that adults should start regular screening  at age 31 and stop at age 84. Others say to start before age 55 or continue after age 92. Talk with your doctor about your risk and when to start and stop screening.  ?? Heart attack and stroke risk. At least every 4 to 6 years, you should have your risk for heart attack and stroke assessed. Your doctor uses factors such as your age, blood pressure, cholesterol, and whether you smoke or have diabetes to show what your risk for a heart attack or stroke is over the next 10 years.   ?? Abdominal aortic aneurysm. Ask your doctor whether you should have a test to check for an aneurysm. You may need a test if you ever smoked or if your parent, brother, sister, or child has had an aneurysm.  When should you call for help?  Watch closely for changes in your health, and be sure to contact your doctor if you have any problems or symptoms that concern you.  Where can you learn more?  Go to [email or web address]  Enter [phone] in the search box to learn more about "Well Visit, Men 50 to 65: Care Instructions."  Current as of: May 31, 2018??????????????????????????????Content Version: 12.6  ?? 2006-2020 Healthwise[employer] Inc[employer].   Care instructions adapted under license by [employer] (which disclaims liability or warranty for this information). If you have questions about a medical condition or this instruction, always ask your healthcare professional. Healthwise, Incorporated disclaims any warranty or liability for your use of this information.              Preventing Falls: Care Instructions  Your Care Instructions    Getting around your home safely can be a challenge if you have injuries or health problems that make it easy for you to fall. Loose rugs and furniture in walkways are among the dangers for many older people who have problems walking or who have poor eyesight. People who have conditions such as arthritis, osteoporosis, or dementia also have to be careful not to fall.  You can make your home safer with a few simple measures.  Follow-up care is a key part of your treatment and safety. Be sure to make and go to all appointments, and call your doctor if you are having problems. It's also a good idea to know your test results and keep a list of the medicines you take.  How can you care for yourself at home?  Taking care of yourself   ?? You may get dizzy if you do not drink enough water. To prevent dehydration, drink plenty of fluids, enough so that your urine is light yellow or clear like water. Choose water and other caffeine-free clear liquids. If you have kidney, heart, or liver disease and have to limit fluids, talk with your doctor before you increase the amount of fluids you drink.  ?? Exercise regularly to improve your strength, muscle tone, and balance. Walk if you can. Swimming may be a good choice if you cannot walk easily.  ?? Have your vision and hearing checked each year or any time you notice a change. If you have trouble seeing and hearing, you might not be able to avoid objects and could lose your balance.  ?? Know the side effects of the medicines you take. Ask your doctor or pharmacist whether the medicines you take can affect your balance. Sleeping pills or sedatives can affect your balance.  ?? Limit the amount of alcohol you drink. Alcohol can impair your balance and  other senses.  ?? Ask your doctor whether calluses or corns on your feet need to be removed. If you wear loose-fitting shoes because of calluses or corns, you can lose your balance and fall.  ?? Talk to your doctor if you have numbness in your feet.  Preventing falls at home  ?? Remove raised doorway thresholds, throw rugs, and clutter. Repair loose carpet or raised areas in the floor.  ?? Move furniture and electrical cords to keep them out of walking paths.  ?? Use nonskid floor wax, and wipe up spills right away, especially on ceramic tile floors.  ?? If you use a walker or cane, put rubber tips on it. If you use crutches, clean the bottoms of them regularly with an abrasive pad, such as steel wool.   ?? Keep your house well lit, especially stairways, porches, and outside walkways. Use night-lights in areas such as hallways and bathrooms. Add extra light switches or use remote switches (such as switches that go on or off when you clap your hands) to make it easier to turn lights on if you have to get up during the night.  ?? Install sturdy handrails on stairways.  ?? Move items in your cabinets so that the things you use a lot are on the lower shelves (about waist level).  ?? Keep a cordless phone and a flashlight with new batteries by your bed. If possible, put a phone in each of the main rooms of your house, or carry a cell phone in case you fall and cannot reach a phone. Or, you can wear a device around your neck or wrist. You push a button that sends a signal for help.  ?? Wear low-heeled shoes that fit well and give your feet good support. Use footwear with nonskid soles. Check the heels and soles of your shoes for wear. Repair or replace worn heels or soles.  ?? Do not wear socks without shoes on wood floors.  ?? Walk on the grass when the sidewalks are slippery. If you live in an area that gets snow and ice in the winter, sprinkle salt on slippery steps and sidewalks.  Preventing falls in the bath  ?? Install grab bars and nonskid mats inside and outside your shower or tub and near the toilet and sinks.  ?? Use shower chairs and bath benches.  ?? Use a hand-held shower head that will allow you to sit while showering.  ?? Get into a tub or shower by putting the weaker leg in first. Get out of a tub or shower with your strong side first.  ?? Repair loose toilet seats and consider installing a raised toilet seat to make getting on and off the toilet easier.  ?? Keep your bathroom door unlocked while you are in the shower.  Where can you learn more?  Go to InsuranceStats.cahttp://www.healthwise.net/[employer].  Enter G117 in the search box to learn more about "Preventing Falls: Care Instructions."  Current as of: March 19, 2016   Content Version: 11.8  ?? 2006-2018 Healthwise, Incorporated. Care instructions adapted under license by [employer] (which disclaims liability or warranty for this information). If you have questions about a medical condition or this instruction, always ask your healthcare professional. Healthwise, Incorporated disclaims any warranty or liability for your use of this information.

## 2019-01-07 NOTE — Assessment & Plan Note (Signed)
Discussed PT for balance and gait training. Patient is homeless and declines for now.

## 2019-01-07 NOTE — Assessment & Plan Note (Signed)
Pitting of fingernails suggestive of psoriasis. Not addressed today given more pressing issues addressed.

## 2019-01-07 NOTE — Assessment & Plan Note (Signed)
Patient is no longer taking Effexor and was never able to start gabapentin due to financial stressors. I provided him with a Good Rx card today and we found that both medications could be filled for less than a total $25 per month, which he feels he'll be able to afford. I recommended continued follow-up with psychiatry and will reach out to [facility] on his behalf to as that they call him to schedule with Dr. Joanne Gavel.

## 2019-01-07 NOTE — Assessment & Plan Note (Signed)
Reached out to Child psychotherapist. Unfortunately he is over income for food stamps and has exhausted one-time funds available through [facility] which he used to fix his truck. He owes too much on his truck and camper to sell either. He is unwilling to leave them to go to a homeless shelter and therefore has nowhere to bath. His SSDI income is barely sufficient to pay his bills. His church community is providing him with meals. He does not qualify for behavioral case management as he does not have full medicaid. I will try to refer him for CM services through [facility] as he voiced a willingness for this. I provided him with a list of food pantries and homeless shelters and encouraged him to call. Follow-up three months.

## 2019-01-07 NOTE — Assessment & Plan Note (Signed)
Resume treatment with Effexor. Recommended follow-up with psychiatry.

## 2019-01-07 NOTE — Assessment & Plan Note (Signed)
??   We discussed that pre-diabetes is a precursor to Diabetes and it's very important to keep the weight down in order to prevent it's progression.  ?? 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 He eats meals provided by his church community. We discussed the importance of avoiding sodas and sweets, but he will not have much capacity to make choices or changes to his diet right now.   ?? A1c was reviewed with the patient and there is no need for medication at this time.  ?? Repeat Glycohemoglobin and follow-up in 3 months.

## 2019-01-07 NOTE — Assessment & Plan Note (Signed)
Resume treatment with Effexor and start gabapentin as recommended by Dr. Joanne Gavel. Good Rx coupon provided. Recommended follow-up with psychiatry.

## 2019-01-07 NOTE — Assessment & Plan Note (Signed)
??   Discussed the importance of regular aerobic exercise and eating a healthy diet.  ?? Discussed age-appropriate routine health maintenance. Fasting lipid and metabolic profiles ordered along with PSA and HCV screening. Follow-up three months.   ?? Immunizations: Influenza and Pneumovax administered today. Shingrix and Tdap sent to pharmacy.   ?? Follow-up in one year.

## 2019-01-07 NOTE — Assessment & Plan Note (Signed)
Resume treatment with Effexor and start gabapentin as recommended by Dr. Gruen. Good Rx coupon provided. Recommended follow-up with psychiatry.

## 2019-01-07 NOTE — Assessment & Plan Note (Signed)
Pitting of fingernails suggestive of psoriasis. Not addressed today given more pressing issues addressed.

## 2019-01-07 NOTE — Assessment & Plan Note (Signed)
Resume treatment with Effexor. Recommended follow-up with psychiatry.

## 2019-01-07 NOTE — Assessment & Plan Note (Addendum)
??   Discussed the importance of regular aerobic exercise and eating a healthy diet.  ?? Discussed age-appropriate routine health maintenance. Fasting lipid and metabolic profiles ordered along with PSA and HCV screening. Follow-up three months.   ?? Immunizations: Influenza and Pneumovax administered today. Shingrix and Tdap sent to pharmacy.   ?? Follow-up in one year.

## 2019-01-07 NOTE — Assessment & Plan Note (Signed)
Reached out to social worker. Unfortunately he is over income for food stamps and has exhausted one-time funds available through [facility] which he used to fix his truck. He owes too much on his truck and camper to sell either. He is unwilling to leave them to go to a homeless shelter and therefore has nowhere to bath. His SSDI income is barely sufficient to pay his bills. His church community is providing him with meals. He does not qualify for behavioral case management as he does not have full medicaid. I will try to refer him for CM services through [facility] as he voiced a willingness for this. I provided him with a list of food pantries and homeless shelters and encouraged him to call. Follow-up three months.

## 2019-01-07 NOTE — Assessment & Plan Note (Signed)
Patient is no longer taking Effexor and was never able to start gabapentin due to financial stressors. I provided him with a Good Rx card today and we found that both medications could be filled for less than a total $25 per month, which he feels he'll be able to afford. I recommended continued follow-up with psychiatry and will reach out to [facility] on his behalf to as that they call him to schedule with Dr. Gruen.

## 2019-01-07 NOTE — Assessment & Plan Note (Signed)
Discussed PT for balance and gait training. Patient is homeless and declines for now.

## 2019-01-08 NOTE — Telephone Encounter (Signed)
Please call [facility] to make a grant funded referral for case management- [phone]   Please contact [facility] and ask them to reach out to patient to schedule follow-up with Dr. Joanne Gavel.

## 2019-01-11 NOTE — Telephone Encounter (Signed)
Noted and appreciated. Please keep me posted.

## 2019-01-11 NOTE — Telephone Encounter (Signed)
Received a call from staff at [facility] asking me to reach out to patient to set up a follow-up appointment.  He has not been seen since July 04, 2018.  I attempted to reach him by phone,  I left a message on his voice mail to call and set up an appointment.

## 2019-01-11 NOTE — Telephone Encounter (Signed)
1.) I called Stephen Meadows at [facility] and she will reach out to him but said that Dr. Joanne Gavel is booking out quite a ways.  She said that he has not been seen there since June and that was the first time as well.    2.) I called [facility] and left a detailed message to call us back regarding a CM referral.  Will hold for a CB from them.

## 2019-01-11 NOTE — Telephone Encounter (Signed)
Will check on this in 2 weeks. If patient has not reached [name], I would suggest that we close the [facility] chart to reduce liability at this time. Can certainly re-open if/when patient contacts office (depending on delay).

## 2019-01-15 NOTE — Telephone Encounter (Signed)
I called Burna Mortimer back at [facility] and placed the referral for grant funded case management.

## 2019-01-15 NOTE — Telephone Encounter (Signed)
Gardiner Barefoot, RN      ??      Burna Mortimer from [facility] called back for you. ??She states the message left for her did not have a patient name. ??She can be reached at [phone].

## 2019-01-25 NOTE — Telephone Encounter (Signed)
No contact from patient at this point.   Will close chart to Dr. Joanne Gavel. If/when patient contacts our office we will review options for re-opening vs. New referral.

## 2019-01-27 ENCOUNTER — Inpatient Hospital Stay
Admit: 2019-01-27 | Discharge: 2019-01-27 | Disposition: A | Discharge status: Home / Self Care | Payer: MEDICARE | Attending: Emergency Medicine

## 2019-01-27 ENCOUNTER — Emergency Department: Admit: 2019-01-27 | Payer: MEDICARE | Primary: Family

## 2019-01-27 LAB — GLUCOSE, POC: Glucose (POC): 193 mg/dL

## 2019-01-27 LAB — POCT GLUCOSE: POC Glucose: 193 mg/dL

## 2019-01-27 MED ORDER — CEPHALEXIN 500 MG CAP
500 mg | ORAL_CAPSULE | Freq: Four times a day (QID) | ORAL | 0 refills | Status: AC
Start: 2019-01-27 — End: 2019-02-03

## 2019-01-27 MED ORDER — IBUPROFEN 400 MG TAB
400 mg | ORAL | Status: AC
Start: 2019-01-27 — End: 2019-01-27
  Administered 2019-01-27: 15:00:00 via ORAL

## 2019-01-27 MED ORDER — CEPHALEXIN 250 MG CAP
250 mg | ORAL | Status: AC
Start: 2019-01-27 — End: 2019-01-27
  Administered 2019-01-27: 15:00:00 via ORAL

## 2019-01-27 MED FILL — CEPHALEXIN 250 MG CAP: 250 mg | ORAL | Qty: 2

## 2019-01-27 MED FILL — IBUPROFEN 200 MG TAB: 200 mg | ORAL | Qty: 1

## 2019-01-27 NOTE — ED Notes (Signed)
Pt provided with a toasted peanut butter and jelly sandwich with coffee as permitted by ED provider.

## 2019-01-27 NOTE — ED Notes (Signed)
Pt homeless and lives in a camper with little heat. Has numbness 4th  Through little toe . since 1/22. Had several socks on foot. Toes are red. Has feeling on top of foot. States pain around ankle with weight bearing. Previous fracture with pins.

## 2019-01-27 NOTE — ED Provider Notes (Signed)
Patient is a 59 year old man, history of pre diabetes and depression but no suicidal thoughts, states he lives in a camper with no heat, who presents now to the emergency department with 2-3 days of right toe redness, intermittent pain and numbness.  The patient states he wears multiple layers of socks due to the cold.  Two or 3 days ago he noticed that his right 3rd toe felt numb.  He took off his socks and saw that there was some redness to the toes.  Since that time he has had intermittent pain and the numbness has been persistent despite trying to warm the foot.  He has intermittent pain in the toes which makes it difficult to walk.  He states he has a history of poor coordination with his arms and hands particularly on the right which is his dominant side and so he can not use crutches or a walker.  He has tried these before without success.  Does not have any pain medications to take at home.  He has an appointment on Tuesday with [facility] regarding [facility] depression.  He denies any suicidal thoughts but states he has been depressed since July of last year.  He does not feel like he wants to speak with the psychiatric nurse practitioner at this time.  He states he came because he was concerned about his foot and toes.  He does have a history of surgery to the right ankle in the past.  He denies any recent falls or known trauma to the area.  He denies fever, chills, shortness of breath, cough, nausea or vomiting.    The history is provided by the patient.   Foot Pain   Associated symptoms include numbness.        Past Medical History:   Diagnosis Date   ??? Moderate episode of recurrent major depressive disorder (HCC) 03/22/2018   ??? Prediabetes 03/22/2018       Past Surgical History:   Procedure Laterality Date   ??? HX CARPAL TUNNEL RELEASE Right 2004   ??? HX ORTHOPAEDIC Right 02/2018    Larey Seat off of ladder and shattered his right heel. Dr. Elijah Birk at [facility]   ??? HX OTHER SURGICAL  2014    Spinal  cord stimulator         Family History:   Problem Relation Age of Onset   ??? Diabetes Mother    ??? Pancreatic Cancer Mother    ??? Melanoma Mother    ??? Diabetes Father    ??? Stroke Father         on blood thinners       Social History     Socioeconomic History   ??? Marital status: MARRIED     Spouse name: Not on file   ??? Number of children: Not on file   ??? Years of education: Not on file   ??? Highest education level: Not on file   Occupational History   ??? Not on file   Social Needs   ??? Financial resource strain: Not on file   ??? Food insecurity     Worry: Not on file     Inability: Not on file   ??? Transportation needs     Medical: Not on file     Non-medical: Not on file   Tobacco Use   ??? Smoking status: Current Every Day Smoker     Packs/day: 0.50     Years: 43.00     Pack years:  21.50     Types: Cigarettes     Start date: 1977   ??? Smokeless tobacco: Never Used   ??? Tobacco comment: 1.5 pack a day stopped on 03/05/18 resumed on 04/05/18   Substance and Sexual Activity   ??? Alcohol use: Not Currently     Frequency: Never     Drinks per session: 1 or 2     Binge frequency: Never   ??? Drug use: Yes     Frequency: 7.0 times per week     Types: Marijuana   ??? Sexual activity: Yes     Partners: Female   Lifestyle   ??? Physical activity     Days per week: Not on file     Minutes per session: Not on file   ??? Stress: Not on file   Relationships   ??? Social Wellsite geologist on phone: Not on file     Gets together: Not on file     Attends religious service: Not on file     Active member of club or organization: Not on file     Attends meetings of clubs or organizations: Not on file     Relationship status: Not on file   ??? Intimate partner violence     Fear of current or ex partner: Not on file     Emotionally abused: Not on file     Physically abused: Not on file     Forced sexual activity: Not on file   Other Topics Concern   ??? Not on file   Social History Narrative    Marital status: Married, [name] - 9 years    Living situation:      Pets in the home: 2 dogs, 1 cat     Children: None- 2 step children     Occupation: Disabled    Highest education: HS    Alcohol,tobacco, drug ZOX:[id number] and marijuana use     Caffeine/ diet: Keto,  Decaf coffee    Exercise: None     Military- None                  ALLERGIES: Patient has no known allergies.    Review of Systems   Constitutional: Negative for fever.   HENT: Negative.    Eyes: Negative.    Respiratory: Negative for cough and shortness of breath.    Cardiovascular: Negative.    Gastrointestinal: Negative for abdominal pain.   Genitourinary: Negative.    Musculoskeletal:        See HPI   Skin: Positive for color change.   Neurological: Positive for numbness.   All other systems reviewed and are negative.      Vitals:    01/27/19 0915   BP: 120/84   Pulse: 88   Resp: 20   Temp: 97.9 ??F (36.6 ??C)   SpO2: 100%   Weight: 78.5 kg (173 lb)            Physical Exam  Vitals signs and nursing note reviewed.     General: awake and alert, no acute distress, mildly disheveled, wearing multiple layers including 3 pairs of socks on the left foot  Head: normocephalic, atraumatic  Neck:  Supple, painless range of motion, no cervical lymphadenopathy  HEENT: PERRL bilaterally, no conjunctival pallor, moist mucous membranes  Cardiovascular: heart regular rate and rhythm without murmurs, rubs or gallops, equal bilateral radial and DP pulses  Pulmonary: lungs clear to auscultation bilaterally, no wheezes rales  or rhonchi, no respiratory distress  Abdomen:  Nondistended  Extremities: no lower extremity edema, no joint swelling, no bilateral calf swelling or tenderness to palpation, there is mild hyperemia to the toes on the right foot diffusely with warmth, superficial skin breakdown in the webspace between the right 4th and 5th toes but no induration or purulent drainage and no foul odor, no lymphangitis streaking of the right foot, there is tenderness to palpation of the right 3rd toe, patient complains of subjective  decreased sensation to light touch throughout all the toes on the right foot  Skin: pink, warm, dry, see extremity exam, no lacerations abrasions or other wounds to the right foot or ankle  Neuro: alert and oriented, moves all extremities equally and spontaneously, ambulates with a limp but otherwise without ataxia or need for assistance, no focal neurologic deficits         MDM  Number of Diagnoses or Management Options  Frostbite of right foot, initial encounter  Right foot pain  Diagnosis management comments: This is a 59 year old male who presents now to the emergency department with intermittent pain, tenderness and subjective decreased sensation to the toes on the right foot.  This is most prominent in the right 3rd toe.  The patient denies any known trauma.  I will order an x-ray to rule out fracture such as stress fracture.  I think that this is less likely.  The patient denies a history of diabetes but on review of his records I see that he has a history of prediabetes.  I will order a fingerstick blood sugar.  I do not see any obvious wounds to suggest a nidus for cellulitis but the patient has some superficial skin breakdown in the webspace between the right 4th and 5th toes.  Given the patient's lack of access to resources and difficulty with follow-up, he is essentially homeless, I am going to err on the side of caution and start him on an antibiotic given the warmth to touch on the right foot compared to the left which could be suggestive of infection.  I counseled the patient that it is also possible that he suffered a frostbite injury and the persistent hyperemia is an inflammatory response to damaged tissue from prolonged cold exposure.  I do not see any evidence of necrosis.  I discussed with the patient the possibility of trying a cane to assist with walking since he is limping.  He will be given 600 mg oral ibuprofen and a 1st dose of Keflex.       Amount and/or Complexity of Data  Reviewed  Clinical lab tests: reviewed  Tests in the radiology section of CPT??: reviewed      ED Course as of Jan 27 1131   Sat Jan 27, 2019   1045 The patient's blood sugar is 193 but he had just been given food by the nurse prior to having this checked.  There is no clear evidence of diabetes on this non fasting blood sugar.    [JF]   1046 On the patient's right foot x-ray I see no evidence of fracture, dislocation or subcutaneous emphysema.  I will ask the nurse to give the patient a cane to see if he is able to ambulate more easily with this.  Plan is to discharge him home with a prescription for Keflex.  He states that he should be able to pick this up from [facility] as it is on the 4 dollar plan.    [JF]  ED Course User Index  [JF] Waldo Laine, MD       Procedures      NIH Stroke Scale

## 2019-01-27 NOTE — ED Notes (Signed)
Pt provided with a toasted peanut butter and jelly sandwich with coffee as permitted by ED provider.

## 2019-01-27 NOTE — ED Triage Notes (Addendum)
Pt homeless and lives in a camper with little heat. Has numbness 4th  Through little toe . since 1/22. Had several socks on foot. Toes are red. Has feeling on top of foot. States pain around ankle with weight bearing. Previous fracture with pins.

## 2019-01-27 NOTE — ED Provider Notes (Signed)
Patient is a 59 year old man, history of pre diabetes and depression but no suicidal thoughts, states he lives in a camper with no heat, who presents now to the emergency department with 2-3 days of right toe redness, intermittent pain and numbness.  The patient states he wears multiple layers of socks due to the cold.  Two or 3 days ago he noticed that his right 3rd toe felt numb.  He took off his socks and saw that there was some redness to the toes.  Since that time he has had intermittent pain and the numbness has been persistent despite trying to warm the foot.  He has intermittent pain in the toes which makes it difficult to walk.  He states he has a history of poor coordination with his arms and hands particularly on the right which is his dominant side and so he can not use crutches or a walker.  He has tried these before without success.  Does not have any pain medications to take at home.  He has [facility] on Tuesday with [facility].  He denies any suicidal thoughts but states he has been depressed since July of last year.  He does not feel like he wants to speak with the psychiatric nurse practitioner at this time.  He states he came because he was concerned about his foot and toes.  He does have a history of surgery to the right ankle in the past.  He denies any recent falls or known trauma to the area.  He denies fever, chills, shortness of breath, cough, nausea or vomiting.    The history is provided by the patient.   Foot Pain   Associated symptoms include numbness.        Past Medical History:   Diagnosis Date   ??? Moderate episode of recurrent major depressive disorder (HCC) 03/22/2018   ??? Prediabetes 03/22/2018       Past Surgical History:   Procedure Laterality Date   ??? HX CARPAL TUNNEL RELEASE Right 2004   ??? HX ORTHOPAEDIC Right 02/2018    Larey Seat off of ladder and shattered his right heel. Dr. Elijah Birk at [facility]   ??? HX OTHER SURGICAL  2014     Spinal cord stimulator         Family History:   Problem Relation Age of Onset   ??? Diabetes Mother    ??? Pancreatic Cancer Mother    ??? Melanoma Mother    ??? Diabetes Father    ??? Stroke Father         on blood thinners       Social History     Socioeconomic History   ??? Marital status: MARRIED     Spouse name: Not on file   ??? Number of children: Not on file   ??? Years of education: Not on file   ??? Highest education level: Not on file   Occupational History   ??? Not on file   Social Needs   ??? Financial resource strain: Not on file   ??? Food insecurity     Worry: Not on file     Inability: Not on file   ??? Transportation needs     Medical: Not on file     Non-medical: Not on file   Tobacco Use   ??? Smoking status: Current Every Day Smoker     Packs/day: 0.50     Years: 43.00     Pack years:  21.50     Types: Cigarettes     Start date: 1977   ??? Smokeless tobacco: Never Used   ??? Tobacco comment: 1.5 pack a day stopped on 03/05/18 resumed on 04/05/18   Substance and Sexual Activity   ??? Alcohol use: Not Currently     Frequency: Never     Drinks per session: 1 or 2     Binge frequency: Never   ??? Drug use: Yes     Frequency: 7.0 times per week     Types: Marijuana   ??? Sexual activity: Yes     Partners: Female   Lifestyle   ??? Physical activity     Days per week: Not on file     Minutes per session: Not on file   ??? Stress: Not on file   Relationships   ??? Social Wellsite geologist on phone: Not on file     Gets together: Not on file     Attends religious service: Not on file     Active member of club or organization: Not on file     Attends meetings of clubs or organizations: Not on file     Relationship status: Not on file   ??? Intimate partner violence     Fear of current or ex partner: Not on file     Emotionally abused: Not on file     Physically abused: Not on file     Forced sexual activity: Not on file   Other Topics Concern   ??? Not on file   Social History Narrative    Marital status: Married, [name] - 9 years     Living situation:     Pets in the home: 2 dogs, 1 cat     Children: None- 2 step children     Occupation: Disabled    Highest education: HS    Alcohol,tobacco, drug PJR:PZPSUG and marijuana use     Caffeine/ diet: Keto,  Decaf coffee    Exercise: None     Military- None                  ALLERGIES: Patient has no known allergies.    Review of Systems   Constitutional: Negative for fever.   HENT: Negative.    Eyes: Negative.    Respiratory: Negative for cough and shortness of breath.    Cardiovascular: Negative.    Gastrointestinal: Negative for abdominal pain.   Genitourinary: Negative.    Musculoskeletal:        See HPI   Skin: Positive for color change.   Neurological: Positive for numbness.   All other systems reviewed and are negative.      Vitals:    01/27/19 0915   BP: 120/84   Pulse: 88   Resp: 20   Temp: 97.9 ??F (36.6 ??C)   SpO2: 100%   Weight: 78.5 kg (173 lb)            Physical Exam  Vitals signs and nursing note reviewed.     General: awake and alert, no acute distress, mildly disheveled, wearing multiple layers including 3 pairs of socks on the left foot  Head: normocephalic, atraumatic  Neck:  Supple, painless range of motion, no cervical lymphadenopathy  HEENT: PERRL bilaterally, no conjunctival pallor, moist mucous membranes  Cardiovascular: heart regular rate and rhythm without murmurs, rubs or gallops, equal bilateral radial and DP pulses  Pulmonary: lungs clear to auscultation bilaterally, no wheezes rales  or rhonchi, no respiratory distress  Abdomen:  Nondistended   Extremities: no lower extremity edema, no joint swelling, no bilateral calf swelling or tenderness to palpation, there is mild hyperemia to the toes on the right foot diffusely with warmth, superficial skin breakdown in the webspace between the right 4th and 5th toes but no induration or purulent drainage and no foul odor, no lymphangitis streaking of the right foot, there is tenderness to palpation of the right 3rd toe, patient complains of subjective decreased sensation to light touch throughout all the toes on the right foot  Skin: pink, warm, dry, see extremity exam, no lacerations abrasions or other wounds to the right foot or ankle  Neuro: alert and oriented, moves all extremities equally and spontaneously, ambulates with a limp but otherwise without ataxia or need for assistance, no focal neurologic deficits         MDM  Number of Diagnoses or Management Options  Frostbite of right foot, initial encounter  Right foot pain   Diagnosis management comments: This is a 59 year old male who presents now to the emergency department with intermittent pain, tenderness and subjective decreased sensation to the toes on the right foot.  This is most prominent in the right 3rd toe.  The patient denies any known trauma.  I will order an x-ray to rule out fracture such as stress fracture.  I think that this is less likely.  The patient denies a history of diabetes but on review of his records I see that he has a history of prediabetes.  I will order a fingerstick blood sugar.  I do not see any obvious wounds to suggest a nidus for cellulitis but the patient has some superficial skin breakdown in the webspace between the right 4th and 5th toes.  Given the patient's lack of access to resources and difficulty with follow-up, he is essentially homeless, I am going to err on the side of caution and start him on an antibiotic given the warmth to touch on the right foot compared to the left which could be suggestive of infection.  I counseled the patient that it is also possible that he suffered a frostbite injury and the persistent hyperemia is an inflammatory response to damaged tissue from prolonged cold exposure.  I do not see any evidence of necrosis.  I discussed with the patient the possibility of trying a cane to assist with walking since he is limping.  He will be given 600 mg oral ibuprofen and a 1st dose of Keflex.       Amount and/or Complexity of Data Reviewed  Clinical lab tests: reviewed  Tests in the radiology section of CPT??: reviewed      ED Course as of Jan 27 1131   Sat Jan 27, 2019   1045 The patient's blood sugar is 193 but he had just been given food by the nurse prior to having this checked.  There is no clear evidence of diabetes on this non fasting blood sugar.    [JF]    1046 On the patient's right foot x-ray I see no evidence of fracture, dislocation or subcutaneous emphysema.  I will ask the nurse to give the patient a cane to see if he is able to ambulate more easily with this.  Plan is to discharge him home with a prescription for Keflex.  He states that he should be able to pick this up from [facility] as it is on the 4 dollar plan.    [  JF]      ED Course User Index  [JF] Waldo Laine, MD       Procedures      NIH Stroke Scale

## 2019-01-29 ENCOUNTER — Inpatient Hospital Stay
Admit: 2019-01-29 | Discharge: 2019-01-30 | Discharge status: Against Medical Advice | Payer: MEDICARE | Attending: Emergency Medicine

## 2019-01-29 DIAGNOSIS — R29898 Other symptoms and signs involving the musculoskeletal system: Secondary | ICD-10-CM

## 2019-01-29 NOTE — ED Notes (Signed)
Pt presents with 6/10 pain to right foot and right hand. Reports diagnosed with frostbite 2 days ago. Pian continues and radiates up back of right leg. Also reports new numbness to right hand.

## 2019-01-29 NOTE — ED Provider Notes (Addendum)
ED Provider Notes by Molly Maduro, MD at 01/29/19 1836                Author: Molly Maduro, MD  Service: Emergency Medicine  Author Type: Physician       Filed: 01/29/19 1938  Date of Service: 01/29/19 1836  Status: Signed          Editor: Molly Maduro, MD (Physician)                   Patient presents the ER for evaluation of his right lower extremity.  He reports having continued pain of his right foot that radiates up to his mid lower extremity.  He has been taking the cephalexin prescribed to him.  He denies any further cold injury.   He is currently staying in a hotel.  He also reports having loss sensation to his right hand and also having weakness of his right hand and right lower extremity.  He states that the weakness started yesterday around the time that he was taking a warm  bath.  He was seen here for frostbite.  It was felt to be secondary to being in an on heated trailer for prolonged period of time.  It was not suspected to be a deep frostbite.  He was started empirically on cephalexin due to possible concern for cellulitis.           Past Medical History:        Diagnosis  Date         ?  Moderate episode of recurrent major depressive disorder (HCC)  03/22/2018         ?  Prediabetes  03/22/2018             Past Surgical History:         Procedure  Laterality  Date          ?  HX CARPAL TUNNEL RELEASE  Right  2004     ?  HX ORTHOPAEDIC  Right  02/2018          Larey Seat off of ladder and shattered his right heel. Dr. Elijah Birk at [facility]          ?  HX OTHER SURGICAL    2014          Spinal cord stimulator               Family History:         Problem  Relation  Age of Onset          ?  Diabetes  Mother       ?  Pancreatic Cancer  Mother       ?  Melanoma  Mother       ?  Diabetes  Father       ?  Stroke  Father                on blood thinners             Social History          Socioeconomic History         ?  Marital status:  MARRIED               Spouse name:  Not on file         ?  Number of children:  Not on file     ?  Years of education:  Not  on file     ?  Highest education level:  Not on file       Occupational History        ?  Not on file       Social Needs         ?  Financial resource strain:  Not on file        ?  Food insecurity              Worry:  Not on file         Inability:  Not on file        ?  Transportation needs              Medical:  Not on file         Non-medical:  Not on file       Tobacco Use         ?  Smoking status:  Current Every Day Smoker              Packs/day:  0.50         Years:  43.00         Pack years:  21.50         Types:  Cigarettes         Start date:  51         ?  Smokeless tobacco:  Never Used        ?  Tobacco comment: 1.5 pack a day stopped on 03/05/18 resumed on 04/05/18       Substance and Sexual Activity         ?  Alcohol use:  Not Currently              Frequency:  Never         Drinks per session:  1 or 2         Binge frequency:  Never         ?  Drug use:  Yes              Frequency:  7.0 times per week         Types:  Marijuana         ?  Sexual activity:  Yes              Partners:  Female       Lifestyle        ?  Physical activity              Days per week:  Not on file         Minutes per session:  Not on file         ?  Stress:  Not on file       Relationships        ?  Social Health visitor on phone:  Not on file         Gets together:  Not on file         Attends religious service:  Not on file         Active member of club or organization:  Not on file         Attends meetings of clubs or organizations:  Not on file         Relationship status:  Not on file        ?  Intimate partner violence              Fear of current or ex partner:  Not on file         Emotionally abused:  Not on file         Physically abused:  Not on file         Forced sexual activity:  Not on file        Other Topics  Concern        ?  Not on file       Social History Narrative          Marital status:  Married, [name] - 9 years       Living situation:        Pets in the home: 2 dogs, 1 cat        Children: None- 2 step children        Occupation: Disabled       Highest education: HS       Alcohol,tobacco, drug GQQ:[id number] and marijuana use        Caffeine/ diet: Keto,  Decaf coffee       Exercise: None        Military- None                                ALLERGIES: Patient has no known allergies.         PHYSICAL EXAM:   Gen: Well appearing, no acute distress   HENT: Normocephalic, atraumatic, moist mucous membranes.   Neck: Supple, normal range of motion, no nuchal rigidity/stiffness.   CV: Regular rate and rhythm, normal S1 and S2, no harsh murmurs.   Resp: Clear to auscultation bilaterally, no wheezes, rales, or rhonchi. Speaking in full sentences.   GI: Soft to palpation, non-tender, non-distended. No hernia or mass noted. No hepatosplenomegaly palpated.   MSK: No edema.                        Skin: Mild, scattered erythematous changes to right distal foot and toes, Warm and well perfused.   Neuro: CNII-XII intact, as tested by: no visual field defect. No disconjugate gaze. PERRL, EOMI. face symmetric. face sensation intact, equal. Speech normal without dysarthria or dysphonia. Hearing grossly intact. Normal, symmetric masseter strength.  Normal, symmetric sternocleidomastoid strength and shoulder shrug. Tongue midline. No pronator drift. 4/5 right hand grip. 3/5 strength of RLE. Marland Kitchen Normal finger to nose limited due to RUE weakness       Vitals:          01/29/19 1818        BP:  119/74     Pulse:  68     Resp:  18     Temp:  98.2 ??F (36.8 ??C)     SpO2:  96%     Weight:  79.4 kg (175 lb)        Height:  5\' 9"  (1.753 m)           ROS:   CONSTITUTIONAL_ROS: no_fever, (-)chills.    ENT_ROS: (-)sore throat, (-)earache.   EYES_ROS: (-)visual changes, (-)eye pain   CARDIOTHORACIC_ROS: (-)chest_pain, (-)palpitations, (-)SOB, no_cough.   GASTROINTESTINAL_ROS: (-)nausea, (-)vomiting.   GENITOURINARY_ROS: (-)dysuria,  (-)abnormal appearing urine, (-)polyuria   MUSCULOSKELETAL_ROS: (-)muscle pain, (-)joint pain.   SKIN_ROS: (-)rash, (-)wound   NEUROLOGICAL_ROS: (-)LOC (-)headache.   ENDOCRINE_ROS: (-)fatigue, (+)weakness.  HEMATOLOGIC_ROS:, (-)bleeding, (-)bruising.                         Procedures         NIH Stroke Scale           Differential diagnoses:  Post frostbite pain, post frostbite neuropathy, stroke, unspecified weakness, paresthesia      MDM:  The patient has definite weakness of his right upper and lower extremities.  Typically, with frostbite, and pain and numbness.  However, he is demonstrating weakness of his extremities.  He states that this started yesterday.  This would imply that  he out of the window for thrombolytics.  I informed him that he will need a stroke evaluation and likely admission to continue this evaluation.  He expressed concern that his trailer will be towed and states that he needs to move it so it isn't towed.   I informed that he should not lead to do this and should remain in the ER and in the hospital for workup.  Despite this, he states that he has to go and states that he will return after he gets this resolved.      The patient declines further evaluation and/or treatment. Patient is alert, oriented to (+)person (+)place (+)time, articulating clearly and ambulatory with a cain. Patient is clinically  sober and has decision making capacity and is able to understand the risks as discussed with them. This action is against medical advice provided to the patient by C. Opal Sidles M.D. and the decision was made with informed refusal. The patient was told that  further evaluation and/or treatment is necessary and a full explanation of the rationale was given. The risks of action were explained to the patient and include, but are not limited to, worsening of known or currently unknown conditions, permanent disability  and death from undiagnosed or untreated conditions.   The patient indicated  understanding the clinical situation and the explanation of the risks of leaving. The patient voluntarily accepts these risks and a signed AMA form documenting  the conversation with Orvis Brill M.D. was obtained. The patient was given the opportunity to ask questions and reconsider. The patient was encouraged to return to the Emergency Department at any time for further care

## 2019-01-29 NOTE — Telephone Encounter (Signed)
Pt went to the ED Saturday and was diagnosed with frostbite to his right toes as he has been living in his camper with no heat.  Pt's church put him up at [facility] but he has to be out tomorrow. He has an appt at [facility] tomorrow and I also told him if they cannot help him with housing, he can also check with [facility].  He took a hot bath last night and again this morning.  He said that the white skin he had on his toes peeled off after the bath and he now has swelling to his toes.  All the toes on his right foot are numb and he said that he has pain to the back of his right leg up to his knee.  He said that his kneecap is all white right now.  His right hand is also numb and painful as well.  Zella Ball, do you want the pt to go to Urgent Care or the ED to be rechecked?  Please see ED report in chart.  Thanks.

## 2019-01-29 NOTE — Telephone Encounter (Signed)
Please ask him to go to the ED.

## 2019-01-29 NOTE — ED Provider Notes (Signed)
Patient presents the ER for evaluation of his right lower extremity.  He reports having continued pain of his right foot that radiates up to his mid lower extremity.  He has been taking the cephalexin prescribed to him.  He denies any further cold injury.  He is currently staying in a hotel.  He also reports having loss sensation to his right hand and also having weakness of his right hand and right lower extremity.  He states that the weakness started yesterday around the time that he was taking a warm bath.  He was seen here for frostbite.  It was felt to be secondary to being in an on heated trailer for prolonged period of time.  It was not suspected to be a deep frostbite.  He was started empirically on cephalexin due to possible concern for cellulitis.      Past Medical History:   Diagnosis Date   ??? Moderate episode of recurrent major depressive disorder (HCC) 03/22/2018   ??? Prediabetes 03/22/2018       Past Surgical History:   Procedure Laterality Date   ??? HX CARPAL TUNNEL RELEASE Right 2004   ??? HX ORTHOPAEDIC Right 02/2018    Larey Seat off of ladder and shattered his right heel. Dr. Elijah Birk at [facility]   ??? HX OTHER SURGICAL  2014    Spinal cord stimulator         Family History:   Problem Relation Age of Onset   ??? Diabetes Mother    ??? Pancreatic Cancer Mother    ??? Melanoma Mother    ??? Diabetes Father    ??? Stroke Father         on blood thinners       Social History     Socioeconomic History   ??? Marital status: MARRIED     Spouse name: Not on file   ??? Number of children: Not on file   ??? Years of education: Not on file   ??? Highest education level: Not on file   Occupational History   ??? Not on file   Social Needs   ??? Financial resource strain: Not on file   ??? Food insecurity     Worry: Not on file     Inability: Not on file   ??? Transportation needs     Medical: Not on file     Non-medical: Not on file   Tobacco Use   ??? Smoking status: Current Every Day Smoker     Packs/day: 0.50     Years: 43.00      Pack years: 21.50     Types: Cigarettes     Start date: 35   ??? Smokeless tobacco: Never Used   ??? Tobacco comment: 1.5 pack a day stopped on 03/05/18 resumed on 04/05/18   Substance and Sexual Activity   ??? Alcohol use: Not Currently     Frequency: Never     Drinks per session: 1 or 2     Binge frequency: Never   ??? Drug use: Yes     Frequency: 7.0 times per week     Types: Marijuana   ??? Sexual activity: Yes     Partners: Female   Lifestyle   ??? Physical activity     Days per week: Not on file     Minutes per session: Not on file   ??? Stress: Not on file   Relationships   ??? Social Wellsite geologist on  phone: Not on file     Gets together: Not on file     Attends religious service: Not on file     Active member of club or organization: Not on file     Attends meetings of clubs or organizations: Not on file     Relationship status: Not on file   ??? Intimate partner violence     Fear of current or ex partner: Not on file     Emotionally abused: Not on file     Physically abused: Not on file     Forced sexual activity: Not on file   Other Topics Concern   ??? Not on file   Social History Narrative    Marital status: Married, Mickel Baas - 9 years    Living situation:     Pets in the home: 2 dogs, 1 cat     Children: None- 2 step children     Occupation: Disabled    Highest education: HS    Alcohol,tobacco, drug GLO:[id number] and marijuana use     Caffeine/ diet: Keto,  Decaf coffee    Exercise: None     Military- None                  ALLERGIES: Patient has no known allergies.      PHYSICAL EXAM:  Gen: Well appearing, no acute distress  HENT: Normocephalic, atraumatic, moist mucous membranes.  Neck: Supple, normal range of motion, no nuchal rigidity/stiffness.  CV: Regular rate and rhythm, normal S1 and S2, no harsh murmurs.  Resp: Clear to auscultation bilaterally, no wheezes, rales, or rhonchi. Speaking in full sentences.  GI: Soft to palpation, non-tender, non-distended. No hernia or mass noted. No hepatosplenomegaly palpated.   MSK: No edema.            Skin: Mild, scattered erythematous changes to right distal foot and toes, Warm and well perfused.  Neuro: CNII-XII intact, as tested by: no visual field defect. No disconjugate gaze. PERRL, EOMI. face symmetric. face sensation intact, equal. Speech normal without dysarthria or dysphonia. Hearing grossly intact. Normal, symmetric masseter strength. Normal, symmetric sternocleidomastoid strength and shoulder shrug. Tongue midline. No pronator drift. 4/5 right hand grip. 3/5 strength of RLE. Marland Kitchen Normal finger to nose limited due to RUE weakness    Vitals:    01/29/19 1818   BP: 119/74   Pulse: 68   Resp: 18   Temp: 98.2 ??F (36.8 ??C)   SpO2: 96%   Weight: 79.4 kg (175 lb)   Height: 5\' 9"  (1.753 m)       ROS:  CONSTITUTIONAL_ROS: no_fever, (-)chills.   ENT_ROS: (-)sore throat, (-)earache.  EYES_ROS: (-)visual changes, (-)eye pain  CARDIOTHORACIC_ROS: (-)chest_pain, (-)palpitations, (-)SOB, no_cough.  GASTROINTESTINAL_ROS: (-)nausea, (-)vomiting.  GENITOURINARY_ROS: (-)dysuria, (-)abnormal appearing urine, (-)polyuria  MUSCULOSKELETAL_ROS: (-)muscle pain, (-)joint pain.  SKIN_ROS: (-)rash, (-)wound  NEUROLOGICAL_ROS: (-)LOC (-)headache.  ENDOCRINE_ROS: (-)fatigue, (+)weakness.  HEMATOLOGIC_ROS:, (-)bleeding, (-)bruising.                  Procedures      NIH Stroke Scale        Differential diagnoses:  Post frostbite pain, post frostbite neuropathy, stroke, unspecified weakness, paresthesia     MDM:  The patient has definite weakness of his right upper and lower extremities.  Typically, with frostbite, and pain and numbness.  However, he is demonstrating weakness of his extremities.  He states that this started yesterday.  This would imply that he out of the window for thrombolytics.  I informed him that he will need a stroke evaluation and likely admission to continue this evaluation.  He expressed concern that his trailer will be towed and states that he needs to move it so it isn't towed.  I informed that he should not lead to do this and should remain in the ER and in the hospital for workup.  Despite this, he states that he has to go and states that he will return after he gets this resolved.    The patient declines further evaluation and/or treatment. Patient is alert, oriented to (+)person (+)place (+)time, articulating clearly and ambulatory with a cain. Patient is clinically sober and has decision making capacity and is able to understand the risks as discussed with them. This action is against medical advice provided to the patient by C. Opal Sidles M.D. and the decision was made with informed refusal. The patient was told that further evaluation and/or treatment is necessary and a full explanation of the rationale was given. The risks of action were explained to the patient and include, but are not limited to, worsening of known or currently unknown conditions, permanent disability and death from undiagnosed or untreated conditions.  The patient indicated understanding the clinical situation and the explanation of the risks of leaving. The patient voluntarily accepts these risks and a signed AMA form documenting the conversation with Orvis Brill M.D. was obtained. The patient was given the opportunity to ask questions and reconsider. The patient was encouraged to return to the Emergency Department at any time for further care

## 2019-01-29 NOTE — Telephone Encounter (Signed)
I called the pt back and got his VM.  I left him a detailed VM message asking him to go back to the ED.    Will hold to check on pt tomorrow and also speak with CM about referring the pt to [facility].

## 2019-01-29 NOTE — Telephone Encounter (Signed)
Pt name & DOB verified.  When did you go to the ER? 01-27-2019  Emergency room visited: [facility]  What were you seen for? Frost bite  How are you feeling today? States they gave him medications although his toes are numb and tingling. States that the pain has gone up to his knee behind his knee cap. States that it is effecting his hand. States that his church has placed him in a room and he has the heat on up to 75 degrees and he still feels very cold. Would like to know if this is normal and would like a call back. States he lives in a camper. States he is scared to go back to the camper with no heat. States he is meeting with [facility] tomorrow.     Booked appointment on  at  with .  CB# [phone] Darcie Farrel Demark

## 2019-01-29 NOTE — ED Triage Notes (Signed)
Pt presents with 6/10 pain to right foot and right hand. Reports diagnosed with frostbite 2 days ago. Pian continues and radiates up back of right leg. Also reports new numbness to right hand.

## 2019-01-30 ENCOUNTER — Inpatient Hospital Stay
Admit: 2019-01-30 | Discharge: 2019-01-31 | Disposition: A | Discharge status: Home / Self Care | Payer: MEDICARE | Attending: Physician Assistant

## 2019-01-30 DIAGNOSIS — M79674 Pain in right toe(s): Secondary | ICD-10-CM

## 2019-01-30 DIAGNOSIS — T33821A Superficial frostbite of right foot, initial encounter: Secondary | ICD-10-CM

## 2019-01-30 NOTE — ED Provider Notes (Signed)
This is a 59 year old male comes to the emergency room today with a complaint of right-sided foot pain, numbness, tingling and reported weakness since 01/27/2019.      Patient was seen in the emergency room on 01/27/2019 for right-sided foot pain and possible frostbite.  At that time patient was complaining of numbness to his toes, redness, and being exposed to extreme cold as he is currently staying in an unheated trailer.  At the time patient provider had noted that he has had a history of poor coordination to his right hand which he again tells me today has been ongoing for many years and increases with stressful situations.  He states frequently he will lose coordination of his right hand especially during stressful situations.  Notes that this is the case here today.     Patient was seen again on 01/29/2019 and presented with numbness and tingling to the right side of his body.  Also numbness and tingling to his right foot.  At that time the provider was concern for possible stroke-like symptoms and had discussed with the patient obtaining a head CT.  Patient signed out of the ED without medical treatment.      Today patient again presents with continued right foot pain to the distal toes also numbness and tingling to his right foot which has been ongoing since his visit on 01/23 for frostbite but worsened with recent cold exposure.Stephen Meadows He also presents with inability to use his right hand which is his baseline.  He believes his situation has caused him stress which is why he is unable to use his right hand at this time as this is a common stress response for him and his foot pain may have been exacerbated by exposure to cold as he is continuing to live and the unheated trailer despite being provided with a hotel room, case management and being encouraged to follow up during his ER visits.    Patient denies any chest pain, shortness of breath.  He denies any numbness, tingling, loss of sensation loss of function  of his facial muscles or other extremities with the exception the weakness, numbness and tingling inability use right hand which is his baseline.  He denies any vision changes, headache.  He denies any previous history of stroke.  He does note increasing pain and numbness in his right foot similar to previous frostbite and cold exposure symptoms.    He denies any fever or chills.    This past medical history significant for pre diabetes and depressive disorder.  Patient denies any feelings of suicidality here today.  He states he has been under increased stress but is hopeful as he has recently entered into a new relationship and is waiting for his significant other to move to [place].           Past Medical History:   Diagnosis Date   ??? Moderate episode of recurrent major depressive disorder ([facility]) 03/22/2018   ??? Prediabetes 03/22/2018       Past Surgical History:   Procedure Laterality Date   ??? HX CARPAL TUNNEL RELEASE Right 2004   ??? HX ORTHOPAEDIC Right 02/2018    [place] [address] off of ladder and shattered his right heel. Dr. Marcelline Deist at [facility]   ??? HX OTHER SURGICAL  2014    Spinal cord stimulator         Family History:   Problem Relation Age of Onset   ??? Diabetes Mother    ??? Pancreatic  Cancer Mother    ??? Melanoma Mother    ??? Diabetes Father    ??? Stroke Father         on blood thinners       Social History     Socioeconomic History   ??? Marital status: MARRIED     Spouse name: Not on file   ??? Number of children: Not on file   ??? Years of education: Not on file   ??? Highest education level: Not on file   Occupational History   ??? Not on file   Social Needs   ??? Financial resource strain: Not on file   ??? Food insecurity     Worry: Not on file     Inability: Not on file   ??? Transportation needs     Medical: Not on file     Non-medical: Not on file   Tobacco Use   ??? Smoking status: Current Every Day Smoker     Packs/day: 0.50     Years: 43.00     Pack years: 21.50     Types: Cigarettes     Start date: 5   ???  Smokeless tobacco: Never Used   ??? Tobacco comment: 1.5 pack a day stopped on 03/05/18 resumed on 04/05/18   Substance and Sexual Activity   ??? Alcohol use: Not Currently     Frequency: Never     Drinks per session: 1 or 2     Binge frequency: Never   ??? Drug use: Yes     Frequency: 7.0 times per week     Types: Marijuana   ??? Sexual activity: Yes     Partners: Female   Lifestyle   ??? Physical activity     Days per week: Not on file     Minutes per session: Not on file   ??? Stress: Not on file   Relationships   ??? Social Wellsite geologist on phone: Not on file     Gets together: Not on file     Attends religious service: Not on file     Active member of club or organization: Not on file     Attends meetings of clubs or organizations: Not on file     Relationship status: Not on file   ??? Intimate partner violence     Fear of current or ex partner: Not on file     Emotionally abused: Not on file     Physically abused: Not on file     Forced sexual activity: Not on file   Other Topics Concern   ??? Not on file   Social History Narrative    Marital status: Married, [name] - 9 years    Living situation:     Pets in the home: 2 dogs, 1 cat     Children: None- 2 step children     Occupation: Disabled    Highest education: HS    Alcohol,tobacco, drug [id number] and marijuana use     Caffeine/ diet: Keto,  Decaf coffee    Exercise: None     Military- None                  ALLERGIES: Patient has no known allergies.    Review of Systems   Constitutional: Negative for chills, fatigue and fever.   HENT: Negative for congestion, rhinorrhea and sore throat.         No upper respiratory symptoms to suggest COVID-19  exposure   Eyes: Negative for visual disturbance.   Respiratory: Negative for cough and shortness of breath.    Cardiovascular: Negative for chest pain.   Gastrointestinal: Negative for abdominal pain, diarrhea, nausea and vomiting.   Skin: Negative for color change and rash.   Neurological: Positive for weakness (Right hand,  baseline for patient) and numbness (Right hand which is baseline for patient  And now and right foot and distal toes for the past few days).   Psychiatric/Behavioral: Negative for self-injury and suicidal ideas.       Vitals:    01/30/19 1729 01/30/19 1750   BP: 134/67    Pulse: 88    Temp: (!) 95.4 ??F (35.2 ??C) 98.1 ??F (36.7 ??C)   SpO2: 96%             Physical Exam  Vitals signs and nursing note reviewed.   Constitutional:       Appearance: Normal appearance.   HENT:      Head: Normocephalic and atraumatic.   Eyes:      General: No visual field deficit.  Neck:      Musculoskeletal: Normal range of motion and neck supple.   Cardiovascular:      Rate and Rhythm: Normal rate and regular rhythm.      Pulses: Normal pulses.      Heart sounds: No murmur. No friction rub. No gallop.    Pulmonary:      Effort: Pulmonary effort is normal.      Breath sounds: Normal breath sounds.   Abdominal:      General: Abdomen is flat. Bowel sounds are normal.   Musculoskeletal: Normal range of motion.         General: Tenderness present.      Comments: Patient's right foot to the lateral ankle has a surgical scar, patient has some slightly decreased warmth to the right foot over the area of hardware    Patient has 2+ bilateral dorsalis pedis by palpation and posterior tibialis 2+ bilaterally    Patient has brisk capillary refill to the bilateral feet other than the area of hardware patient has bilateral feet that are warm to palpation    He has some pain to palpation over the last 3 digits of the right foot    Patient has good strength against resistance with flexion and extension of the foot, leg, knee and hip joints    Patient is noted to have some transient weakness to his right hand however is absent during certain times of the exam and he is observed using it completely     Skin:     General: Skin is warm.      Capillary Refill: Capillary refill takes less than 2 seconds.      Findings: No erythema or rash.   Neurological:       Mental Status: He is alert.      GCS: GCS eye subscore is 4. GCS verbal subscore is 5. GCS motor subscore is 6.      Cranial Nerves: Cranial nerves are intact. No cranial nerve deficit, dysarthria or facial asymmetry.      Sensory: Sensation is intact.      Motor: Weakness (Right hand chronic) present. No tremor, atrophy, abnormal muscle tone, seizure activity or pronator drift.      Coordination: Romberg sign negative. Coordination normal. Finger-Nose-Finger Test and Heel to [facility] Test normal. Rapid alternating movements normal.      Gait: Gait is intact. Gait normal.  Comments: Some increased pain to his right distal toes but no decreased sensation   Psychiatric:         Mood and Affect: Mood normal.         Behavior: Behavior normal.          MDM  Number of Diagnoses or Management Options  Pain of toe of right foot  Diagnosis management comments: At this time low suspicion for any acute pathology such as ischemic stroke as patient is at his baseline by his own report   Patient denies any headache, vision changes, numbness, tingling loss of sensation loss of function weakness of any of his facial muscles and he denies any denies any new numbness, tingling, weakness of any of his extremities and has a negative neurological exam with the exception of his areas of numbness which are baseline for him    After discussing this with my mentor here in the ED Trish. H. NP, do not feel patient needs additional imaging or blood work  Patient denies any suicidal ideation     Suspect the his pain and numbness in his right foot is likely a result of cold exposure to the right foot    No evidence of ischemic changes to the limb, no evidence of injury or overlying skin changes to suggest injury, no additional acute frostbite noted    His right-sided arm weakness is his baseline in seems to be a stress response by his report    Possible his numbness, discomfort, pain in the toes may be related to a peripheral neuropathy, as I  see at 1 time patient has been prescribed gabapentin although he is uncertain as what this was for, or may also be worsening of cold sensitivity from previous mild frostbite    Recommend patient to wear warm clothing, patient was given multiple beers of warm socks prior to discharge from the ED, recommended patient protect foot from exposure to the cold as this may worsen the numbness, tingling, pain with cold exposure  Recommend patient to return to the emergency room as needed for any new or worsening symptoms including but not limited to worsening weakness, numbness, tingling, loss of sensation loss of function of the extremities headache, numbness, tingling, drooping of any of the facial muscles as this may indicate new or worsening condition  Patient should follow-up with his PCP Robin Dobrinick as soon as possible and utilized the case management resources we have provided to him    Engaged in shared decision making with pt   Pt advised of the most likely etiology of their symptoms, possible but less likely early illness of more serious etiology. Need for expedited f/u w/ PCP, and given strict return precautions. Pt understands and is amenable with plan.    The plan was developed with patient after reviewing risks and benefits, of medication/medical therapy options.  Patient agrees with the plan of care.  All patient's questions, concerns addressed and answered.  Advised on typical signs and symptoms warranting follow-up or emergency care, see after visit summary instructions for recommended symptomatic care, further details regarding follow up, and plan of care.     DISCLAIMER: This note was created using voice recognition dictation software. Efforts were made by me to ensure accuracy, however some errors may be present due to limitations of this technology and occasionally words are not transcribed correctly           Procedures      NIH Stroke Scale

## 2019-01-30 NOTE — ED Notes (Signed)
Pt seen in the ED yesterday for concern for stroke and he left AMA. He was encouraged to return with any concerns. He states he can not feel the toes on his right foot

## 2019-01-30 NOTE — ED Provider Notes (Signed)
This is a 59 year old male comes to the emergency room today with a complaint of right-sided foot pain, numbness, tingling and reported weakness since 01/27/2019.      Patient was seen in the emergency room on 01/27/2019 for right-sided foot pain and possible frostbite.  At that time patient was complaining of numbness to his toes, redness, and being exposed to extreme cold as he is currently staying in an unheated trailer.  At the time patient provider had noted that he has had a history of poor coordination to his right hand which he again tells me today has been ongoing for many years and increases with stressful situations.  He states frequently he will lose coordination of his right hand especially during stressful situations.  Notes that this is the case here today.     Patient was seen again on 01/29/2019 and presented with numbness and tingling to the right side of his body.  Also numbness and tingling to his right foot.  At that time the provider was concern for possible stroke-like symptoms and had discussed with the patient obtaining a head CT.  Patient signed out of the ED without medical treatment.      Today patient again presents with continued right foot pain to the distal toes also numbness and tingling to his right foot which has been ongoing since his visit on 01/23 for frostbite but worsened with recent cold exposure.Marland Kitchen He also presents with inability to use his right hand which is his baseline.  He believes his situation has caused him stress which is why he is unable to use his right hand at this time as this is a common stress response for him and his foot pain may have been exacerbated by exposure to cold as he is continuing to live and the unheated trailer despite being provided with a hotel room, case management and being encouraged to follow up during his ER visits.     Patient denies any chest pain, shortness of breath.  He denies any numbness, tingling, loss of sensation loss of function of his facial muscles or other extremities with the exception the weakness, numbness and tingling inability use right hand which is his baseline.  He denies any vision changes, headache.  He denies any previous history of stroke.  He does note increasing pain and numbness in his right foot similar to previous frostbite and cold exposure symptoms.    He denies any fever or chills.    This past medical history significant for pre diabetes and depressive disorder.  Patient denies any feelings of suicidality here today.  He states he has been under increased stress but is hopeful as he has recently entered into a new relationship and is waiting for his significant other to move to [place].           Past Medical History:   Diagnosis Date   ??? Moderate episode of recurrent major depressive disorder ([facility]) 03/22/2018   ??? Prediabetes 03/22/2018       Past Surgical History:   Procedure Laterality Date   ??? HX CARPAL TUNNEL RELEASE Right 2004   ??? HX ORTHOPAEDIC Right 02/2018    [place] off of ladder and shattered his right heel. Dr. Elijah Birk at [facility]   ??? HX OTHER SURGICAL  2014    Spinal cord stimulator         Family History:   Problem Relation Age of Onset   ??? Diabetes Mother    ??? Pancreatic  Cancer Mother    ??? Melanoma Mother    ??? Diabetes Father    ??? Stroke Father         on blood thinners       Social History     Socioeconomic History   ??? Marital status: MARRIED     Spouse name: Not on file   ??? Number of children: Not on file   ??? Years of education: Not on file   ??? Highest education level: Not on file   Occupational History   ??? Not on file   Social Needs   ??? Financial resource strain: Not on file   ??? Food insecurity     Worry: Not on file     Inability: Not on file   ??? Transportation needs     Medical: Not on file     Non-medical: Not on file   Tobacco Use    ??? Smoking status: Current Every Day Smoker     Packs/day: 0.50     Years: 43.00     Pack years: 21.50     Types: Cigarettes     Start date: 74   ??? Smokeless tobacco: Never Used   ??? Tobacco comment: 1.5 pack a day stopped on 03/05/18 resumed on 04/05/18   Substance and Sexual Activity   ??? Alcohol use: Not Currently     Frequency: Never     Drinks per session: 1 or 2     Binge frequency: Never   ??? Drug use: Yes     Frequency: 7.0 times per week     Types: Marijuana   ??? Sexual activity: Yes     Partners: Female   Lifestyle   ??? Physical activity     Days per week: Not on file     Minutes per session: Not on file   ??? Stress: Not on file   Relationships   ??? Social Wellsite geologist on phone: Not on file     Gets together: Not on file     Attends religious service: Not on file     Active member of club or organization: Not on file     Attends meetings of clubs or organizations: Not on file     Relationship status: Not on file   ??? Intimate partner violence     Fear of current or ex partner: Not on file     Emotionally abused: Not on file     Physically abused: Not on file     Forced sexual activity: Not on file   Other Topics Concern   ??? Not on file   Social History Narrative    Marital status: Married, [name] - 9 years    Living situation:     Pets in the home: 2 dogs, 1 cat     Children: None- 2 step children     Occupation: Disabled    Highest education: HS    Alcohol,tobacco, drug [id number] and marijuana use     Caffeine/ diet: Keto,  Decaf coffee    Exercise: None     Military- None                  ALLERGIES: Patient has no known allergies.    Review of Systems   Constitutional: Negative for chills, fatigue and fever.   HENT: Negative for congestion, rhinorrhea and sore throat.         No upper respiratory symptoms to suggest COVID-19  exposure   Eyes: Negative for visual disturbance.   Respiratory: Negative for cough and shortness of breath.    Cardiovascular: Negative for chest pain.    Gastrointestinal: Negative for abdominal pain, diarrhea, nausea and vomiting.   Skin: Negative for color change and rash.   Neurological: Positive for weakness (Right hand, baseline for patient) and numbness (Right hand which is baseline for patient  And now and right foot and distal toes for the past few days).   Psychiatric/Behavioral: Negative for self-injury and suicidal ideas.       Vitals:    01/30/19 1729 01/30/19 1750   BP: 134/67    Pulse: 88    Temp: (!) 95.4 ??F (35.2 ??C) 98.1 ??F (36.7 ??C)   SpO2: 96%             Physical Exam  Vitals signs and nursing note reviewed.   Constitutional:       Appearance: Normal appearance.   HENT:      Head: Normocephalic and atraumatic.   Eyes:      General: No visual field deficit.  Neck:      Musculoskeletal: Normal range of motion and neck supple.   Cardiovascular:      Rate and Rhythm: Normal rate and regular rhythm.      Pulses: Normal pulses.      Heart sounds: No murmur. No friction rub. No gallop.    Pulmonary:      Effort: Pulmonary effort is normal.      Breath sounds: Normal breath sounds.   Abdominal:      General: Abdomen is flat. Bowel sounds are normal.   Musculoskeletal: Normal range of motion.         General: Tenderness present.      Comments: Patient's right foot to the lateral ankle has a surgical scar, patient has some slightly decreased warmth to the right foot over the area of hardware    Patient has 2+ bilateral dorsalis pedis by palpation and posterior tibialis 2+ bilaterally    Patient has brisk capillary refill to the bilateral feet other than the area of hardware patient has bilateral feet that are warm to palpation    He has some pain to palpation over the last 3 digits of the right foot    Patient has good strength against resistance with flexion and extension of the foot, leg, knee and hip joints     Patient is noted to have some transient weakness to his right hand however is absent during certain times of the exam and he is observed using it completely     Skin:     General: Skin is warm.      Capillary Refill: Capillary refill takes less than 2 seconds.      Findings: No erythema or rash.   Neurological:      Mental Status: He is alert.      GCS: GCS eye subscore is 4. GCS verbal subscore is 5. GCS motor subscore is 6.      Cranial Nerves: Cranial nerves are intact. No cranial nerve deficit, dysarthria or facial asymmetry.      Sensory: Sensation is intact.      Motor: Weakness (Right hand chronic) present. No tremor, atrophy, abnormal muscle tone, seizure activity or pronator drift.      Coordination: Romberg sign negative. Coordination normal. Finger-Nose-Finger Test and Heel to [facility] Test normal. Rapid alternating movements normal.      Gait: Gait is intact. Gait normal.  Comments: Some increased pain to his right distal toes but no decreased sensation   Psychiatric:         Mood and Affect: Mood normal.         Behavior: Behavior normal.          MDM  Number of Diagnoses or Management Options  Pain of toe of right foot  Diagnosis management comments: At this time low suspicion for any acute pathology such as ischemic stroke as patient is at his baseline by his own report   Patient denies any headache, vision changes, numbness, tingling loss of sensation loss of function weakness of any of his facial muscles and he denies any denies any new numbness, tingling, weakness of any of his extremities and has a negative neurological exam with the exception of his areas of numbness which are baseline for him    After discussing this with my mentor here in the ED Trish. H. NP, do not feel patient needs additional imaging or blood work  Patient denies any suicidal ideation     Suspect the his pain and numbness in his right foot is likely a result of cold exposure to the right foot     No evidence of ischemic changes to the limb, no evidence of injury or overlying skin changes to suggest injury, no additional acute frostbite noted    His right-sided arm weakness is his baseline in seems to be a stress response by his report    Possible his numbness, discomfort, pain in the toes may be related to a peripheral neuropathy, as I see at 1 time patient has been prescribed gabapentin although he is uncertain as what this was for, or may also be worsening of cold sensitivity from previous mild frostbite    Recommend patient to wear warm clothing, patient was given multiple beers of warm socks prior to discharge from the ED, recommended patient protect foot from exposure to the cold as this may worsen the numbness, tingling, pain with cold exposure  Recommend patient to return to the emergency room as needed for any new or worsening symptoms including but not limited to worsening weakness, numbness, tingling, loss of sensation loss of function of the extremities headache, numbness, tingling, drooping of any of the facial muscles as this may indicate new or worsening condition  Patient should follow-up with his PCP Robin Dobrinick as soon as possible and utilized the case management resources we have provided to him    Engaged in shared decision making with pt   Pt advised of the most likely etiology of their symptoms, possible but less likely early illness of more serious etiology. Need for expedited f/u w/ PCP, and given strict return precautions. Pt understands and is amenable with plan.    The plan was developed with patient after reviewing risks and benefits, of medication/medical therapy options.  Patient agrees with the plan of care.  All patient's questions, concerns addressed and answered.  Advised on typical signs and symptoms warranting follow-up or emergency care, see after visit summary instructions for recommended symptomatic care, further details regarding follow up, and plan of care.      DISCLAIMER: This note was created using voice recognition dictation software. Efforts were made by me to ensure accuracy, however some errors may be present due to limitations of this technology and occasionally words are not transcribed correctly           Procedures      NIH Stroke Scale

## 2019-01-30 NOTE — ED Triage Notes (Signed)
Pt seen in the ED yesterday for concern for stroke and he left AMA. He was encouraged to return with any concerns. He states he can not feel the toes on his right foot

## 2019-01-31 NOTE — Telephone Encounter (Signed)
Pt name and DOB verified.    Pt returning the offices call. Writer was able to schedule an appt with Dr. Ma Rings on 1/29 @ 8:30.    [phone] (home)     Stephen Meadows

## 2019-01-31 NOTE — Telephone Encounter (Signed)
Noted  

## 2019-01-31 NOTE — Telephone Encounter (Signed)
Attempted to call patient back; he answers but can not hear [name].  I will continue to try to reach him.  If he calls back, please schedule f/u ER visit with PCP; if no availability in PCP schedule, please schedule with Dr. Ma Rings or Karma Ganja.

## 2019-01-31 NOTE — Telephone Encounter (Signed)
Pt name and DOB verified. Pt is returning call.  Cb# [phone] Darcie Farrel Demark

## 2019-01-31 NOTE — Telephone Encounter (Signed)
Pt was seen in ED 1/25 and 01/30/19 per brief chart review/ MDM on ED notes recommends pt follow with pcp as soon as possible. Called pt to schedule ED follow up, pt answered however poor connections line broke up then disconnected.     Forwarding to front office to continue to try to reach pt to schedule- Please schedule ED follow up with PCP at pt and pcp's soonest available OV.     Donne Anon RN  11:06 AM 01/31/19

## 2019-02-02 ENCOUNTER — Inpatient Hospital Stay
Admit: 2019-02-02 | Discharge: 2019-02-03 | Disposition: A | Discharge status: Home / Self Care | Payer: MEDICARE | Attending: Physician Assistant

## 2019-02-02 ENCOUNTER — Ambulatory Visit: Payer: MEDICARE | Attending: Family Medicine | Primary: Family

## 2019-02-02 DIAGNOSIS — R209 Unspecified disturbances of skin sensation: Secondary | ICD-10-CM

## 2019-02-02 NOTE — ED Notes (Signed)
 This RN in room to dc patient, pt states Youre not going to get me a ride home. Discussed with pt that he may call taxi as needed but he does not meet criteria to call an ambulance. Pt ambulatory in room without difficulty, refusing dc paper instructions at this time. Pt states you can throw it in the trash. Pt ambulatory to waiting room with personal cell phone.

## 2019-02-02 NOTE — ED Provider Notes (Signed)
59 year old male returns to the emergency department complaining of bilateral cold feet.  Patient is homeless and lives inside of a RV truck.  He states that he has been out of propane and has been unable to obtain any in over a month.  This will be the patient's 4th ER visit within the last 6 days complaining of the same.  We have supplied for him extra clothing to include socks and case management but the patient has failed to follow up.  Previously, we had even made arrangements for him to stay at the [facility] but due to his poor behavior, he was not allowed to stay.  He is also requesting a sandwich.    The history is provided by the patient and medical records.   Numbness  This is a chronic problem. The current episode started more than 1 week ago. The problem has not changed since onset.There was no focality noted. Primary symptoms include loss of sensation.Pertinent negatives include no focal weakness, no loss of balance, no slurred speech, no speech difficulty, no memory loss, no movement disorder, no agitation, no visual change, no auditory change, no mental status change, no unresponsiveness and no disorientation. There has been no fever. Pertinent negatives include no shortness of breath, no chest pain, no vomiting, no altered mental status, no confusion, no headaches, no nausea, no bowel incontinence and no bladder incontinence. There were no medications administered prior to arrival.   Frostbite   The current episode started more than 1 week ago. The problem has not changed since onset.The pain is present in the left toes and right toes. The quality of the pain is described as pounding. The pain is at a severity of 9/10. The pain is severe. Associated symptoms include numbness, limited range of motion and stiffness. Pertinent negatives include no tingling, no itching, no back pain and no neck pain. The symptoms are aggravated by cold. He has tried nothing for the symptoms. There has been no history of  extremity trauma.        Past Medical History:   Diagnosis Date   ??? Moderate episode of recurrent major depressive disorder ([place]) 03/22/2018   ??? Prediabetes 03/22/2018       Past Surgical History:   Procedure Laterality Date   ??? HX CARPAL TUNNEL RELEASE Right 2004   ??? HX ORTHOPAEDIC Right 02/2018    [place] off of ladder and shattered his right heel. Dr. Marcelline Deist at [facility]   ??? HX OTHER SURGICAL  2014    Spinal cord stimulator         Family History:   Problem Relation Age of Onset   ??? Diabetes Mother    ??? Pancreatic Cancer Mother    ??? Melanoma Mother    ??? Diabetes Father    ??? Stroke Father         on blood thinners       Social History     Socioeconomic History   ??? Marital status: MARRIED     Spouse name: Not on file   ??? Number of children: Not on file   ??? Years of education: Not on file   ??? Highest education level: Not on file   Occupational History   ??? Not on file   Social Needs   ??? Financial resource strain: Not on file   ??? Food insecurity     Worry: Not on file     Inability: Not on file   ??? Transportation needs  Medical: Not on file     Non-medical: Not on file   Tobacco Use   ??? Smoking status: Current Every Day Smoker     Packs/day: 0.50     Years: 43.00     Pack years: 21.50     Types: Cigarettes     Start date: 31   ??? Smokeless tobacco: Never Used   ??? Tobacco comment: 1.5 pack a day stopped on 03/05/18 resumed on 04/05/18   Substance and Sexual Activity   ??? Alcohol use: Not Currently     Frequency: Never     Drinks per session: 1 or 2     Binge frequency: Never   ??? Drug use: Yes     Frequency: 7.0 times per week     Types: Marijuana   ??? Sexual activity: Yes     Partners: Female   Lifestyle   ??? Physical activity     Days per week: Not on file     Minutes per session: Not on file   ??? Stress: Not on file   Relationships   ??? Social Wellsite geologist on phone: Not on file     Gets together: Not on file     Attends religious service: Not on file     Active member of club or organization: Not on  file     Attends meetings of clubs or organizations: Not on file     Relationship status: Not on file   ??? Intimate partner violence     Fear of current or ex partner: Not on file     Emotionally abused: Not on file     Physically abused: Not on file     Forced sexual activity: Not on file   Other Topics Concern   ??? Not on file   Social History Narrative    Marital status: Married, [name] - 9 years    Living situation:     Pets in the home: 2 dogs, 1 cat     Children: None- 2 step children     Occupation: Disabled    Highest education: HS    Alcohol,tobacco, drug HLK:TGYBWL and marijuana use     Caffeine/ diet: Keto,  Decaf coffee    Exercise: None     Military- None                  ALLERGIES: Patient has no known allergies.    Review of Systems   Constitutional: Negative for fever.   HENT: Negative for congestion, nosebleeds, sinus pressure, sinus pain and trouble swallowing.    Eyes: Negative for visual disturbance.   Respiratory: Negative for cough and shortness of breath.    Cardiovascular: Negative for chest pain.   Gastrointestinal: Negative for abdominal pain, bowel incontinence, nausea and vomiting.   Genitourinary: Negative for bladder incontinence and dysuria.   Musculoskeletal: Positive for arthralgias and stiffness. Negative for back pain and neck pain.   Skin: Positive for color change. Negative for itching and wound.   Allergic/Immunologic: Negative for immunocompromised state.   Neurological: Positive for numbness. Negative for tingling, focal weakness, seizures, speech difficulty, headaches and loss of balance.   Psychiatric/Behavioral: Negative for agitation, confusion, memory loss and self-injury.   All other systems reviewed and are negative.      Vitals:    02/02/19 1742   BP: (!) 127/92   Pulse: 86   Resp: 16   Temp: 97.9 ??F (36.6 ??C)   SpO2:  95%   Weight: 79.4 kg (175 lb)   Height: 5\' 7"  (1.702 m)            Physical Exam  Vitals signs and nursing note reviewed.   Constitutional:       General:  He is not in acute distress.     Appearance: Normal appearance. He is not ill-appearing, toxic-appearing or diaphoretic.   HENT:      Head: Atraumatic.   Neck:      Musculoskeletal: Neck supple.   Cardiovascular:      Rate and Rhythm: Normal rate and regular rhythm.      Pulses:           Dorsalis pedis pulses are 2+ on the right side and 2+ on the left side.      Heart sounds: Normal heart sounds.   Pulmonary:      Effort: Pulmonary effort is normal. No respiratory distress.      Breath sounds: Normal breath sounds.   Musculoskeletal:      Right foot: No Charcot foot or foot drop.      Left foot: No Charcot foot or foot drop.   Feet:      Right foot:      Skin integrity: Dry skin present. No ulcer, blister, skin breakdown or warmth.      Toenail Condition: Right toenails are abnormally thick. Fungal disease present.     Left foot:      Skin integrity: Dry skin present. No ulcer, blister, skin breakdown or warmth.      Toenail Condition: Left toenails are abnormally thick. Fungal disease present.  Skin:     General: Skin is warm and dry.      Capillary Refill: Capillary refill takes 2 to 3 seconds.   Neurological:      General: No focal deficit present.      Mental Status: He is alert and oriented to person, place, and time.   Psychiatric:         Mood and Affect: Mood normal.         Behavior: Behavior normal.         Thought Content: Thought content normal.         Judgment: Judgment normal.          MDM      Narrative:  59 year old male returns to the emergency department complaining of numbness to both of his feet and homelessness.  During his ER stay, he was given food as well as new socks with instructions to call and follow up with his case management.      Discharge instructions provided to patient. Patient agrees to follow up as above for re-evaluation and further management. Agrees to seek emergent re-evaluation for any new, changing or worsening or persisting of symptoms.     DDX:  1. Hypothermia  2.  Frostbite  3. Cellulitis        Past Medical History:   Diagnosis Date   ??? Moderate episode of recurrent major depressive disorder (HCC) 03/22/2018   ??? Prediabetes 03/22/2018        Past Surgical History:   Procedure Laterality Date   ??? HX CARPAL TUNNEL RELEASE Right 2004   ??? HX ORTHOPAEDIC Right 02/2018    03/2018 off of ladder and shattered his right heel. Dr. Larey Seat at [facility]   ??? HX OTHER SURGICAL  2014    Spinal cord stimulator        Allergies:  No Known Allergies     Meds:  No current facility-administered medications for this encounter.     Current Outpatient Medications:   ???  cephALEXin (Keflex) 500 mg capsule, Take 1 Cap by mouth four (4) times daily for 7 days., Disp: 28 Cap, Rfl: 0  ???  Shingrix, PF, 50 mcg/0.5 mL susr injection, Administer 0.69ml IM injection once now and a second injection in 2-6 months  Indications: shingles vaccination, Disp: 0.5 mL, Rfl: 1  ???  venlafaxine (EFFEXOR) 37.5 mg tablet, Take 1 Tab by mouth three (3) times daily. Indications: major depressive disorder, Disp: 270 Tab, Rfl: 3  ???  gabapentin (NEURONTIN) 100 mg capsule, Take 1 BID, Disp: 60 Cap, Rfl: 1     Labs:  Labs below reviewed.  Labs Reviewed - No data to display        Imaging:    No orders to display         Encounter orders:  No orders of the defined types were placed in this encounter.       Vitals:  Vitals:    02/02/19 1742   BP: (!) 127/92   Pulse: 86   Resp: 16   Temp: 97.9 ??F (36.6 ??C)   SpO2: 95%   Weight: 79.4 kg (175 lb)   Height: 5\' 7"  (1.702 m)     Reviewed the patient's vital signs.     Clinical Impression:   1. Homelessness    2. Bilateral cold feet         Disposition: Home     Condition: Good     Discussed pertinent findings with the patient or their representative(s) and have allowed ample time to answer questions that have been posed regarding the patient's current presenting symptoms and disposition plan. The patient and or family members/others understand and agree with the patient's  disposition.     Nursing notes reviewed for past medical history, past surgical history, allergies, and medications.     All available laboratory studies, x-rays, and other diagnostic tests completed in ED reviewed by me.        02/02/2019   02/04/2019, PA:  I confirm that the note above accurately reflects all work, treatment, procedures, and medical decision making performed by me.     Dictation Software Disclaimer:     Parts of this medical record were completed using Ethelda Chick. Sometimes similar sounding words are converted into text and remain in the chart despite proofreading. Please read the entire chart for context.

## 2019-02-02 NOTE — ED Notes (Signed)
Placed multiple warm blankets on pt and extra around pt's feet

## 2019-02-02 NOTE — ED Notes (Signed)
 Pt is well known to this ER, arrives via ambulance today for c/o frost bite to his R foot, pt states he is homeless and lives out of his camper, states his truck broke down and he was sitting in it waiting for someone to come get him when his feet and hands started to go numb, pt concerned he may have gotten frost bite, pt arrives with multiple layers of clothing on, feet are cold to the touch but have no signs of frost bite, warm blankets applied to pt and feet, pt is a+o, rr= and unlabored

## 2019-02-02 NOTE — ED Provider Notes (Signed)
59 year old male returns to the emergency department complaining of bilateral cold feet.  Patient is homeless and lives inside of a RV truck.  He states that he has been out of propane and has been unable to obtain any in over a month.  This will be the patient's 4th ER visit within the last 6 days complaining of the same.  We have supplied for him extra clothing to include socks and case management but the patient has failed to follow up.  Previously, we had even made arrangements for him to stay at the [facility] but due to his poor behavior, he was not allowed to stay.  He is also requesting a sandwich.    The history is provided by the patient and medical records.   Numbness  This is a chronic problem. The current episode started more than 1 week ago. The problem has not changed since onset.There was no focality noted. Primary symptoms include loss of sensation.Pertinent negatives include no focal weakness, no loss of balance, no slurred speech, no speech difficulty, no memory loss, no movement disorder, no agitation, no visual change, no auditory change, no mental status change, no unresponsiveness and no disorientation. There has been no fever. Pertinent negatives include no shortness of breath, no chest pain, no vomiting, no altered mental status, no confusion, no headaches, no nausea, no bowel incontinence and no bladder incontinence. There were no medications administered prior to arrival.   Frostbite    The current episode started more than 1 week ago. The problem has not changed since onset.The pain is present in the left toes and right toes. The quality of the pain is described as pounding. The pain is at a severity of 9/10. The pain is severe. Associated symptoms include numbness, limited range of motion and stiffness. Pertinent negatives include no tingling, no itching, no back pain and no neck pain. The symptoms are aggravated by cold. He has tried nothing for the symptoms. There has been no history of extremity trauma.        Past Medical History:   Diagnosis Date   ??? Moderate episode of recurrent major depressive disorder (HCC) 03/22/2018   ??? Prediabetes 03/22/2018       Past Surgical History:   Procedure Laterality Date   ??? HX CARPAL TUNNEL RELEASE Right 2004   ??? HX ORTHOPAEDIC Right 02/2018    Larey Seat off of ladder and shattered his right heel. Dr. Elijah Birk at [facility]   ??? HX OTHER SURGICAL  2014    Spinal cord stimulator         Family History:   Problem Relation Age of Onset   ??? Diabetes Mother    ??? Pancreatic Cancer Mother    ??? Melanoma Mother    ??? Diabetes Father    ??? Stroke Father         on blood thinners       Social History     Socioeconomic History   ??? Marital status: MARRIED     Spouse name: Not on file   ??? Number of children: Not on file   ??? Years of education: Not on file   ??? Highest education level: Not on file   Occupational History   ??? Not on file   Social Needs   ??? Financial resource strain: Not on file   ??? Food insecurity     Worry: Not on file     Inability: Not on file   ??? Transportation needs  Medical: Not on file     Non-medical: Not on file   Tobacco Use   ??? Smoking status: Current Every Day Smoker     Packs/day: 0.50     Years: 43.00     Pack years: 21.50     Types: Cigarettes     Start date: 1   ??? Smokeless tobacco: Never Used   ??? Tobacco comment: 1.5 pack a day stopped on 03/05/18 resumed on 04/05/18   Substance and Sexual Activity   ??? Alcohol use: Not Currently      Frequency: Never     Drinks per session: 1 or 2     Binge frequency: Never   ??? Drug use: Yes     Frequency: 7.0 times per week     Types: Marijuana   ??? Sexual activity: Yes     Partners: Female   Lifestyle   ??? Physical activity     Days per week: Not on file     Minutes per session: Not on file   ??? Stress: Not on file   Relationships   ??? Social Product manager on phone: Not on file     Gets together: Not on file     Attends religious service: Not on file     Active member of club or organization: Not on file     Attends meetings of clubs or organizations: Not on file     Relationship status: Not on file   ??? Intimate partner violence     Fear of current or ex partner: Not on file     Emotionally abused: Not on file     Physically abused: Not on file     Forced sexual activity: Not on file   Other Topics Concern   ??? Not on file   Social History Narrative    Marital status: Married, Mickel Baas - 9 years    Living situation:     Pets in the home: 2 dogs, 1 cat     Children: None- 2 step children     Occupation: Disabled    Highest education: HS    Alcohol,tobacco, drug EXB:MWUXLK and marijuana use     Caffeine/ diet: Keto,  Decaf coffee    Exercise: None     Military- None                  ALLERGIES: Patient has no known allergies.    Review of Systems   Constitutional: Negative for fever.   HENT: Negative for congestion, nosebleeds, sinus pressure, sinus pain and trouble swallowing.    Eyes: Negative for visual disturbance.   Respiratory: Negative for cough and shortness of breath.    Cardiovascular: Negative for chest pain.   Gastrointestinal: Negative for abdominal pain, bowel incontinence, nausea and vomiting.   Genitourinary: Negative for bladder incontinence and dysuria.   Musculoskeletal: Positive for arthralgias and stiffness. Negative for back pain and neck pain.   Skin: Positive for color change. Negative for itching and wound.   Allergic/Immunologic: Negative for immunocompromised state.    Neurological: Positive for numbness. Negative for tingling, focal weakness, seizures, speech difficulty, headaches and loss of balance.   Psychiatric/Behavioral: Negative for agitation, confusion, memory loss and self-injury.   All other systems reviewed and are negative.      Vitals:    02/02/19 1742   BP: (!) 127/92   Pulse: 86   Resp: 16   Temp: 97.9 ??F (36.6 ??C)  SpO2: 95%   Weight: 79.4 kg (175 lb)   Height: 5\' 7"  (1.702 m)            Physical Exam  Vitals signs and nursing note reviewed.   Constitutional:       General: He is not in acute distress.     Appearance: Normal appearance. He is not ill-appearing, toxic-appearing or diaphoretic.   HENT:      Head: Atraumatic.   Neck:      Musculoskeletal: Neck supple.   Cardiovascular:      Rate and Rhythm: Normal rate and regular rhythm.      Pulses:           Dorsalis pedis pulses are 2+ on the right side and 2+ on the left side.      Heart sounds: Normal heart sounds.   Pulmonary:      Effort: Pulmonary effort is normal. No respiratory distress.      Breath sounds: Normal breath sounds.   Musculoskeletal:      Right foot: No Charcot foot or foot drop.      Left foot: No Charcot foot or foot drop.   Feet:      Right foot:      Skin integrity: Dry skin present. No ulcer, blister, skin breakdown or warmth.      Toenail Condition: Right toenails are abnormally thick. Fungal disease present.     Left foot:      Skin integrity: Dry skin present. No ulcer, blister, skin breakdown or warmth.      Toenail Condition: Left toenails are abnormally thick. Fungal disease present.  Skin:     General: Skin is warm and dry.      Capillary Refill: Capillary refill takes 2 to 3 seconds.   Neurological:      General: No focal deficit present.      Mental Status: He is alert and oriented to person, place, and time.   Psychiatric:         Mood and Affect: Mood normal.         Behavior: Behavior normal.         Thought Content: Thought content normal.          Judgment: Judgment normal.          MDM      Narrative:  59 year old male returns to the emergency department complaining of numbness to both of his feet and homelessness.  During his ER stay, he was given food as well as new socks with instructions to call and follow up with his case management.      Discharge instructions provided to patient. Patient agrees to follow up as above for re-evaluation and further management. Agrees to seek emergent re-evaluation for any new, changing or worsening or persisting of symptoms.     DDX:  1. Hypothermia  2. Frostbite  3. Cellulitis        Past Medical History:   Diagnosis Date   ??? Moderate episode of recurrent major depressive disorder (HCC) 03/22/2018   ??? Prediabetes 03/22/2018        Past Surgical History:   Procedure Laterality Date   ??? HX CARPAL TUNNEL RELEASE Right 2004   ??? HX ORTHOPAEDIC Right 02/2018    03/2018 off of ladder and shattered his right heel. Dr. Larey Seat at [facility]   ??? HX OTHER SURGICAL  2014    Spinal cord stimulator        Allergies:  No Known Allergies     Meds:  No current facility-administered medications for this encounter.     Current Outpatient Medications:   ???  cephALEXin (Keflex) 500 mg capsule, Take 1 Cap by mouth four (4) times daily for 7 days., Disp: 28 Cap, Rfl: 0  ???  Shingrix, PF, 50 mcg/0.5 mL susr injection, Administer 0.95ml IM injection once now and a second injection in 2-6 months  Indications: shingles vaccination, Disp: 0.5 mL, Rfl: 1  ???  venlafaxine (EFFEXOR) 37.5 mg tablet, Take 1 Tab by mouth three (3) times daily. Indications: major depressive disorder, Disp: 270 Tab, Rfl: 3  ???  gabapentin (NEURONTIN) 100 mg capsule, Take 1 BID, Disp: 60 Cap, Rfl: 1     Labs:  Labs below reviewed.  Labs Reviewed - No data to display        Imaging:    No orders to display         Encounter orders:  No orders of the defined types were placed in this encounter.       Vitals:  Vitals:    02/02/19 1742   BP: (!) 127/92   Pulse: 86    Resp: 16   Temp: 97.9 ??F (36.6 ??C)   SpO2: 95%   Weight: 79.4 kg (175 lb)   Height: 5\' 7"  (1.702 m)     Reviewed the patient's vital signs.     Clinical Impression:   1. Homelessness    2. Bilateral cold feet         Disposition: Home     Condition: Good     Discussed pertinent findings with the patient or their representative(s) and have allowed ample time to answer questions that have been posed regarding the patient's current presenting symptoms and disposition plan. The patient and or family members/others understand and agree with the patient's disposition.     Nursing notes reviewed for past medical history, past surgical history, allergies, and medications.     All available laboratory studies, x-rays, and other diagnostic tests completed in ED reviewed by me.        02/02/2019   02/04/2019, PA:  I confirm that the note above accurately reflects all work, treatment, procedures, and medical decision making performed by me.     Dictation Software Disclaimer:     Parts of this medical record were completed using Ethelda Chick. Sometimes similar sounding words are converted into text and remain in the chart despite proofreading. Please read the entire chart for context.

## 2019-02-02 NOTE — Telephone Encounter (Addendum)
Pt name and DOB verified.    Pt called all kinds of things are happening and he feels like he is having a nervous break down     Pt stating he cannot deal with this anymore     Called transferred     [phone] (home)     Stephen Meadows

## 2019-02-02 NOTE — Progress Notes (Deleted)
[facility] AT [facility]   [place] [address]  [phone]    CHIEF COMPLAINT     Stephen Meadows is a 59 y.o. male who presents for an acute visit due to No chief complaint on file.Marland Kitchen    HPI   Stephen Meadows was in ED 3 days ago for Right foot pain, numbness, tingling and weakenss. Onset 6 d ago. Foot was red. Painful and cold at onset.     Episodes of numbness and weakness have been intermittent since July.    Soc hx- divorced in July- stressful.    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      MEDICATIONS     Current Outpatient Medications   Medication Sig   ??? cephALEXin (Keflex) 500 mg capsule Take 1 Cap by mouth four (4) times daily for 7 days.   ??? Shingrix, PF, 50 mcg/0.5 mL susr injection Administer 0.63ml IM injection once now and a second injection in 2-6 months  Indications: shingles vaccination   ??? venlafaxine (EFFEXOR) 37.5 mg tablet Take 1 Tab by mouth three (3) times daily. Indications: major depressive disorder   ??? gabapentin (NEURONTIN) 100 mg capsule Take 1 BID     No current facility-administered medications for this visit.      There are no discontinued medications.    ALLERGIES     No Known Allergies    REVIEW OF SYSTEMS   See HPI    ACTIVE MEDICAL PROBLEMS     Patient Active Problem List   Diagnosis Code   ??? Moderate episode of recurrent major depressive disorder ([place]) F33.1   ??? Prediabetes R73.03   ??? 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 Nocturia R35.1   ??? Anxiety F41.9   ??? 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 At moderate risk for fall Z91.81   ??? Inadequate material resources Z59.8   ??? Intermittent explosive disorder in adult F63.81   ??? Preventative health care Z00.00       SOCIAL HISTORY     Social History     Social History Narrative    Marital status: Married, Stephen Meadows - 9 years    Living situation:     Pets in the home: 2 dogs, 1 cat     Children: None- 2 step children      Occupation: Disabled    Highest education: HS    Alcohol,tobacco, drug AOZ:HYQMVH and marijuana use     Caffeine/ diet: Keto,  Decaf coffee    Exercise: None     Military- None              VITALS   There were no vitals filed for this visit.  There is no height or weight on file to calculate BMI.    PHYSICAL EXAM     Physical Exam    LABS & IMAGING   No results found for this or any previous visit (from the past 24 hour(s)).    ORDERS & MEDS (NEW)   No orders of the defined types were placed in this encounter.      ASSESSMENT AND PLAN     Diagnoses and all orders for this visit:    1. Numbness of right foot            Level of Service based on time:  I personally spent a total of {BILLING ON  UDTH:43888} minutes on today's visit doing chart preparation, review of previous records and labs, performing a medically appropriate evaluation, counseling and educating the patient and documenting clinical information in the electronic health record.     Future Appointments   Date Time Provider Department Center   02/02/2019  8:30 AM Lanier Ensign, DO [facility]   04/03/2019 11:00 AM Dobrinick, Jamison Neighbor, FNP [facility]   01/03/2020  1:30 PM Dobrinick, Jamison Neighbor, FNP [facility]       Lanier Ensign, DO  02/02/2019

## 2019-02-02 NOTE — ED Notes (Signed)
This RN in room to dc patient, pt states "Youre not going to get me a ride home." Discussed with pt that he may call taxi as needed but he does not meet criteria to call an ambulance. Pt ambulatory in room without difficulty, refusing dc paper instructions at this time. Pt states "you can throw it in the trash." Pt ambulatory to waiting room with personal cell phone.

## 2019-02-02 NOTE — ED Notes (Signed)
Placed multiple warm blankets on pt and extra around pt's feet
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
## 2019-02-05 NOTE — Telephone Encounter (Signed)
I attempted to contact patient today to discuss no shows; however, there was no answer.  No show letter sent.    Please address no show policy with patient when they call back and document the conversation in the chart.

## 2019-02-15 NOTE — Telephone Encounter (Signed)
Pt name and DOB verified.    Patient received no show letter. He wanted to call and let office know he was broke down. He states two weeks he has had no transportation. Patient states he will call if not able to make apt in March. Patient states he is in [place]. FYI      Nadara Mode
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
## 2019-02-23 NOTE — Telephone Encounter (Signed)
Pt name DOB verified:    Concern/Problem: Pt is calling. Requesting a call back from provider. States he is getting overwhelmed with his truck.    Call back #: [phone] (home)      Bary Leriche [facility]
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
## 2019-02-23 NOTE — Telephone Encounter (Signed)
Noted  

## 2019-02-26 NOTE — Telephone Encounter (Signed)
Pt name and DOB verified.    Stephen Meadows returning Stephen Meadows's call.  Writer was unable to reach her.      Please call Stephen Meadows back.      B # [phone]      .[name]

## 2019-02-26 NOTE — Telephone Encounter (Signed)
Duplicate note. Please see corresponding phone note.

## 2019-02-26 NOTE — Telephone Encounter (Signed)
Pt name and DOB verified.  ??  Stephen Meadows returning Stephen Meadows's call.  Writer was unable to reach her.  ??  ??  Please call Stephen Meadows back.  ??  ??  B # [phone]  ??  ??  [name]      Left message for Stephen Meadows to call back.   ??

## 2019-02-27 NOTE — Telephone Encounter (Signed)
Left message on Kathy's VM to call back.   Please transfer call to myself or Angelique Blonder when she call back.

## 2019-02-28 ENCOUNTER — Telehealth: Admit: 2019-02-28 | Discharge: 2019-02-28 | Payer: MEDICARE | Attending: Family | Primary: Family

## 2019-02-28 ENCOUNTER — Ambulatory Visit: Attending: Family | Primary: Family

## 2019-02-28 ENCOUNTER — Telehealth: Attending: Family

## 2019-02-28 DIAGNOSIS — Z5989 Other problems related to housing and economic circumstances: Secondary | ICD-10-CM
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
## 2019-02-28 NOTE — Telephone Encounter (Signed)
Left message on Kathy's VM to call back.

## 2019-02-28 NOTE — Telephone Encounter (Signed)
Pt name and DOB verified.  Pt is returning office call. Please call pt back at [phone] (home)   Stephen Meadows
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
## 2019-02-28 NOTE — Telephone Encounter (Signed)
Pt name and DOB verified.    Pt calling in stating he got the parts to fix his truck, he just has to fix it now.    [phone] (home)     Stephen Meadows
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
## 2019-02-28 NOTE — Telephone Encounter (Signed)
Pt name and DOB verified.    Pt calling returning call from [facility], Pt says that he was on a call from [facility] and couldn't get off of it earlier.    [phone] (home)   [name]

## 2019-03-01 ENCOUNTER — Encounter

## 2019-03-01 NOTE — Telephone Encounter (Signed)
Noted and appreciated.

## 2019-03-01 NOTE — Telephone Encounter (Signed)
FYI;    I spoke with Olegario Messier from [facility] and she said that ahe has been having daily contact with the pt.  She is aware of his issues mentioned below and they have been working with trying to get him into a shelter, but there are no shelters in [place] right now.  She has an application in for housing.  She said that several of the entities that may have been able to help him have lost their funding secondary to covid.  I told her that I was going to make a report to [employer] and she will speak with her supervisor to see if they should also file a report as well.    I called [employer] and spoke with intake staff, Lucinda Dell.  She is sending this information to Mable Paris to review and he will respond to [name] within 5 business days.  The case ID # is [id number].    Also see message below from pt.

## 2019-03-01 NOTE — Telephone Encounter (Signed)
Stephen Meadows  1960/03/12    Patient informed of the 24-48 hour refill policy.      Call back needed: yes    Preferred call back number: Call preference: Home phone   [phone] (home)    Telephone Information:   Mobile [phone]        Medications Requested:  Requested Prescriptions     Pending Prescriptions Disp Refills   ??? venlafaxine (EFFEXOR) 37.5 mg tablet 270 Tab 3     Sig: Take 1 Tab by mouth three (3) times daily. Indications: major depressive disorder             Preferred Pharmacy:   [id number] - [place], [place] - [address]  [address]  [place] [address]  Phone: [phone] Fax: [phone]                                       Last visit with provider:  02/28/2019    Future Appointments   Date Time Provider Department Center   07/04/2019  2:15 PM Dobrinick, Jamison Neighbor, FNP [facility]   01/03/2020  1:30 PM Dobrinick, Jamison Neighbor, FNP [facility]       MOST RECENT BLOOD PRESSURES  BP Readings from Last 3 Encounters:   02/02/19 (!) 127/92   01/30/19 134/67   01/29/19 119/74        MOST RECENT LAB DATA  Lab Results   Component Value Date/Time    Creatinine 1.18 12/26/2018 01:33 PM    Potassium 3.9 12/26/2018 01:33 PM    ALT (SGPT) 23 12/26/2018 01:33 PM    HGB 17.7 12/26/2018 01:33 PM    HCT 52.3 (H) 12/26/2018 01:33 PM    Hemoglobin A1c (POC) 6.4 01/02/2019 01:33 PM         [name]

## 2019-03-01 NOTE — Telephone Encounter (Signed)
I just spoke with Virgil Benedict and he still has refill left a the pharmacy and he can just call for refills with them.

## 2019-03-01 NOTE — Telephone Encounter (Signed)
Spoke with Olegario Messier, Pt's CM at [facility].  See other phone note.

## 2019-03-01 NOTE — Telephone Encounter (Signed)
Pt name and DOB verified.    Kathy pts case mang returning call. Please advise.    [phone] ext [phone]    [phone] work cell phone     Tresa Endo L Hatchcock

## 2019-03-05 ENCOUNTER — Telehealth

## 2019-03-05 NOTE — Addendum Note (Signed)
Addendum Note by Lindi Adie, CMA at 03/05/19 1705                Author: Lindi Adie, CMA  Service: --  Author Type: Medical Assistant       Filed: 03/05/19 1705  Encounter Date: 03/05/2019  Status: Signed          Editor: Lindi Adie, CMA (Medical Assistant)          Addended by: Lindi Adie on: 03/05/2019 05:05 PM    Modules accepted: Orders

## 2019-03-05 NOTE — Addendum Note (Signed)
Addended by: Guido Sander A on: 03/05/2019 05:05 PM     Modules accepted: Orders

## 2019-03-05 NOTE — Telephone Encounter (Signed)
FYI:    Spoke with pt who needs to get covid testing before he is able to get into a homeless shelter.  He said that he is doing better this week.  He was able to fix his truck and got his meds.    I scheduled him for a covid test on Wednesday at 1:00 at Urgent Care.  Lab order placed.

## 2019-03-05 NOTE — Telephone Encounter (Signed)
COVID-19 CONCERN: Requesting Swab Testing    Patient name & DOB Verified.    Does patient have a PCP? YES If yes, who? Dobrinick    (If within [facility] ??? please send this note to PCP nurse pool as High Priority. If patient of [facility] or non [facility] patients, attempt to call, if no answer send note to [facility])    Have you been in close contact with someone known to have or is under investigation for COVID-19? NO    Have you had any of the following symptoms? NO  - Fever  - Shortness of Breath  - Cough    (send note to [facility] at [employer]. Inform caller that it could be 24 hours before they get a call back due to a high increase of requests & supply the caller with Swab Clinic phone number. If symptoms are severe they will get a call back from a nurse that day)    AGENT NOTES:     Did agent attempt to reach office for warm transfer? YES     Did agent give caller phone number to call? NO     Did agent inform patient it could be 24 hours before they get a call back? NO     Comments: pt needs a covid test to get into a shelter     CB#: [phone] (home)     Stephen Meadows

## 2019-03-07 ENCOUNTER — Inpatient Hospital Stay: Admit: 2019-03-07 | Discharge: 2019-03-07 | Disposition: A | Discharge status: Home / Self Care | Payer: MEDICARE

## 2019-03-07 ENCOUNTER — Inpatient Hospital Stay: Admit: 2019-03-07 | Payer: MEDICARE | Primary: Family

## 2019-03-07 DIAGNOSIS — Z20822 Contact with and (suspected) exposure to covid-19: Secondary | ICD-10-CM

## 2019-03-07 NOTE — ED Notes (Signed)
Patient here for outpatient  COVID swab only.

## 2019-03-07 NOTE — Telephone Encounter (Signed)
Spoke with Stephen Meadows, he states that this morning he felt nauseous when he woke up and felt very tired right after he woke up. He states that he slept well so he was concerned about feeling groggy. He states that nausea has resolved but he feels run down. He states that he just restarted his Effexor on Friday. He also complains of headache. He did have COVID test today to get clearance to go to shelter. He denies fever, chills, body aches, vomiting, diarrhea, loss of taste/smell, congestion, cough, SOB, recent travel or known exposure to PUI. Stephen Meadows will monitor symptoms and contact the office if there are any new or worsening symptoms. Informed him that we would contact him with these results as soon as they are reviewed. He is agreeable to plan.

## 2019-03-07 NOTE — Telephone Encounter (Signed)
Pt name and DOB verified.    Patient is calling in stating that he is feeling really tired and just not feeling right or good.    Please advise and call patient.    [phone] (home)     Leroy Sea

## 2019-03-07 NOTE — Other (Signed)
Patient here for outpatient  COVID swab only.

## 2019-03-08 LAB — SARS-COV-2: CRSP SARS-CoV-2, PCR: NEGATIVE

## 2019-03-08 LAB — COVID-19: Crsp Sars-Cov-2, PCR: NEGATIVE

## 2019-03-08 NOTE — Telephone Encounter (Signed)
Please notify the pt of his negative covid test.

## 2019-03-08 NOTE — Telephone Encounter (Signed)
LM for pt to CB for his covid results.  Please transfer his call. Thanks.

## 2019-03-08 NOTE — Telephone Encounter (Signed)
The patient was called for notification of a NEGATIVE test result for COVID-19. The following information was given to the patient:    ? The COVID-19 test, also known as novel coronavirus, result was negative  ? You probably were not infected at the time your sample was collected. However, that does not mean you will not get sick.   ? The test result only means that you did not have COVID-19 at the time of testing.  ? If you have no symptoms, continue to use preventive measures to protect yourself and others.   ? If you have been sick, there are many other potential infectious causes.  ? Contact your Primary Care Provider or Care Team for specific guidance, particularly if your symptoms worsen.  ? For more information visit the CDC website: http://www.johnson-fowler.biz/     He will let his CM, Olegario Messier, know of the negative results tomorrow.

## 2019-03-08 NOTE — Telephone Encounter (Signed)
Pt name and DOB verified.    Pt returning call. Please advise.    [phone] (home)     Stephen Meadows

## 2019-04-03 ENCOUNTER — Encounter: Attending: Family | Primary: Family

## 2019-06-29 ENCOUNTER — Emergency Department: Admit: 2019-06-29 | Payer: MEDICARE | Primary: Family

## 2019-06-29 ENCOUNTER — Inpatient Hospital Stay
Admit: 2019-06-29 | Discharge: 2019-06-29 | Disposition: A | Discharge status: Home / Self Care | Payer: MEDICARE | Attending: Emergency Medicine

## 2019-06-29 DIAGNOSIS — R252 Cramp and spasm: Secondary | ICD-10-CM

## 2019-06-29 LAB — METABOLIC PANEL, BASIC
BUN: 18 MG/DL (ref 7–22)
CO2: 29 mmol/L (ref 21–32)
Calcium: 9.1 MG/DL (ref 8.5–10.1)
Chloride: 103 mmol/L (ref 98–108)
Creatinine: 1.31 MG/DL — ABNORMAL HIGH (ref 0.70–1.30)
GFR est AA: >60 mL/min/{1.73_m2} (ref >60)
GFR est non-AA: 57 mL/min/{1.73_m2} — ABNORMAL LOW (ref >60)
Glucose: 95 mg/dL (ref 74–106)
Potassium: 4.1 mmol/L (ref 3.4–5.1)
Sodium: 137 mmol/L (ref 136–145)

## 2019-06-29 LAB — BASIC METABOLIC PANEL
BUN: 18 MG/DL (ref 7–22)
CO2: 29 mmol/L (ref 21–32)
Calcium: 9.1 MG/DL (ref 8.5–10.1)
Chloride: 103 mmol/L (ref 98–108)
Creatinine: 1.31 MG/DL — ABNORMAL HIGH (ref 0.70–1.30)
GFR African American: >60 mL/min/{1.73_m2} (ref >60)
Glucose: 95 mg/dL (ref 74–106)
Potassium: 4.1 mmol/L (ref 3.4–5.1)
Sodium: 137 mmol/L (ref 136–145)
eGFR NON-AA: 57 mL/min/{1.73_m2} — ABNORMAL LOW (ref >60)

## 2019-06-29 MED ORDER — CYCLOBENZAPRINE 10 MG TAB
10 mg | ORAL | Status: AC
Start: 2019-06-29 — End: 2019-06-29
  Administered 2019-06-29: 19:00:00 via ORAL

## 2019-06-29 MED ORDER — IBUPROFEN 400 MG TAB
400 mg | ORAL | Status: AC
Start: 2019-06-29 — End: 2019-06-29
  Administered 2019-06-29: 19:00:00 via ORAL

## 2019-06-29 MED FILL — CYCLOBENZAPRINE 10 MG TAB: 10 mg | ORAL | Qty: 1

## 2019-06-29 MED FILL — IBUPROFEN 200 MG TAB: 200 mg | ORAL | Qty: 1

## 2019-06-29 NOTE — Telephone Encounter (Signed)
Spoke with patient. He states that the skin on his right leg is red, pokadots. Skin will sink in those same spots. Does getting some tingling, not but pain. Mostly below the knee to his ankle. Is also having some pain in his foot when walking and when he wears shoes.     Offered appointment for Tuesday but he elects to go to urgent care in [place] instead.

## 2019-06-29 NOTE — Telephone Encounter (Signed)
Pt name DOB verified:    Concern/Problem: Pt is calling. States for the past 2 months the skin on his rt leg has been collapsing. Pt states his rt leg hurts when he walks and when he puts his shoe on.     Pt is getting concerned and would like a call back from the nurse to discuss.       Call back #: [phone] (home)      [facility]

## 2019-06-29 NOTE — ED Provider Notes (Signed)
Patient is a 59 year old male past medical history depression and pre diabetes who presents requesting evaluation of his right calf.  Patient states for months he has had ???skin occasionally sinking in??? around his medial right calf muscle.  This causes him pain when it occurs.  He denies trauma to the leg.  He states it hurts when he walks.  He does not feel short of breath or have chest pain.  He has not noted swelling of the right lower extremity.  He states he has poor circulation to the leg but cannot tell me what he means by that.  He has never been diagnosed with peripheral vascular disease.  He has no history of PE or DVT.  No surgeries or immobilizations.  No swelling of the lower extremity.  While the patient was being triaged he pointed out that it ???it was happening again.???  The nurse noted that patient was having muscle spasms in the right calf.  On arrival vitals are stable the patient is in no acute distress.    The only medication the patient takes his antidepressant which she is compliant with.  He has an appointment with his PCP next week for evaluation of this issue.           Past Medical History:   Diagnosis Date   ??? Moderate episode of recurrent major depressive disorder (HCC) 03/22/2018   ??? Prediabetes 03/22/2018       Past Surgical History:   Procedure Laterality Date   ??? HX CARPAL TUNNEL RELEASE Right 2004   ??? HX ORTHOPAEDIC Right 02/2018    Larey Seat off of ladder and shattered his right heel. Dr. Elijah Birk at [facility]   ??? HX OTHER SURGICAL  2014    Spinal cord stimulator         Family History:   Problem Relation Age of Onset   ??? Diabetes Mother    ??? Pancreatic Cancer Mother    ??? Melanoma Mother    ??? Diabetes Father    ??? Stroke Father         on blood thinners       Social History     Socioeconomic History   ??? Marital status: MARRIED     Spouse name: Not on file   ??? Number of children: Not on file   ??? Years of education: Not on file   ??? Highest education level: Not on file    Occupational History   ??? Not on file   Tobacco Use   ??? Smoking status: Current Every Day Smoker     Packs/day: 0.50     Years: 43.00     Pack years: 21.50     Types: Cigarettes     Start date: 66   ??? Smokeless tobacco: Never Used   ??? Tobacco comment: 1.5 pack a day stopped on 03/05/18 resumed on 04/05/18   Substance and Sexual Activity   ??? Alcohol use: Not Currently   ??? Drug use: Yes     Frequency: 7.0 times per week     Types: Marijuana   ??? Sexual activity: Yes     Partners: Female   Other Topics Concern   ??? Not on file   Social History Narrative    Marital status: Married, [name] - 9 years    Living situation:     Pets in the home: 2 dogs, 1 cat     Children: None- 2 step children     Occupation: Disabled  Highest education: HS    Alcohol,tobacco, drug ZOX:WRUEAV and marijuana use     Caffeine/ diet: Keto,  Decaf coffee    Exercise: None     Hotel manager- None              Social Determinants of Health     Financial Resource Strain:    ??? Difficulty of Paying Living Expenses:    Food Insecurity:    ??? Worried About Programme researcher, broadcasting/film/video in the Last Year:    ??? Barista in the Last Year:    Transportation Needs:    ??? Freight forwarder (Medical):    ??? Lack of Transportation (Non-Medical):    Physical Activity:    ??? Days of Exercise per Week:    ??? Minutes of Exercise per Session:    Stress:    ??? Feeling of Stress :    Social Connections:    ??? Frequency of Communication with Friends and Family:    ??? Frequency of Social Gatherings with Friends and Family:    ??? Attends Religious Services:    ??? Database administrator or Organizations:    ??? Attends Engineer, structural:    ??? Marital Status:    Intimate Programme researcher, broadcasting/film/video Violence:    ??? Fear of Current or Ex-Partner:    ??? Emotionally Abused:    ??? Physically Abused:    ??? Sexually Abused:          ALLERGIES: Patient has no known allergies.    Review of Systems   Constitutional: Negative for chills and fever.   HENT: Negative for rhinorrhea.    Respiratory: Negative for  shortness of breath.    Cardiovascular: Negative for chest pain and leg swelling.   Gastrointestinal: Negative for abdominal pain, nausea and vomiting.   Musculoskeletal: Positive for myalgias.   Neurological: Negative for headaches.   Psychiatric/Behavioral: Negative for suicidal ideas.       Vitals:    06/29/19 1324   BP: 131/72   Pulse: 90   Resp: 18   Temp: 98.1 ??F (36.7 ??C)   SpO2: 97%   Weight: 79.4 kg (175 lb)   Height: 5\' 8"  (1.727 m)            Physical Exam     General: Awake alert oriented, no acute distress, nontoxic  Head: Normocephalic atraumatic  Cardiovascular: Regular rate and rhythm S1-S2 noted, no murmur, rub or gallop appreciated  Pulmonary: Clear to auscultation bilaterally no wheezes, rales or rhonchi appreciated  Extremities: Full range of motion in all extremities, all 4 extremities are warm and well-perfused.  There are occasional muscle spasms noted to the right posterior gastrocnemius.  There is no calf swelling or any lower extremity swelling bilaterally.  Patient has palpable pulses in PT DP bilaterally.  There is tenderness on palpation of the posterior right calf but no palpable cords are appreciated.  Skin: Intact grossly, no rashes appreciated  Neurologic: No focal neurological deficits appreciated  Psychiatric: Mood appropriate, affect normal, no suicidal homicidal ideation        MDM  ED Course as of Jun 28 1544   Fri Jun 29, 2019   1515 Patient presented with pain in the right calf.  Was noted to have some muscle spasms during my exam.  BMP is pending to rule out any electrolyte abnormality namely hypokalemia which can lead to muscle spasms.  The patient got a Doppler of his lower right extremity and it  is negative for any DVT.  He was given Motrin and Flexeril.  We are awaiting the BMP results.    [EM]   1544 Electrolytes are within normal limits.  Creatinine is 1.13 which is up slightly from last checked 6 months ago.  Patient has an appointment with his PCP on Wednesday next  week.  They can discuss his creatinine and make sure to recheck it and then it returns within normal range.  Patient feels reassured on his negative workup and will be discharged home.    [EM]      ED Course User Index  [EM] Aldean Ast, DO       Procedures      NIH Stroke Scale

## 2019-06-29 NOTE — ED Notes (Signed)
 Patient reports 2 months of my skin is sinking in while pointing at calf. Muscle appears to look like spasms. Patient reports pain with ambulation. Reports poor circulation to that leg.

## 2019-07-03 ENCOUNTER — Telehealth

## 2019-07-03 NOTE — Telephone Encounter (Signed)
All set.

## 2019-07-03 NOTE — Telephone Encounter (Signed)
Please review and sign referral order if agreeable.  Thanks.

## 2019-07-03 NOTE — Telephone Encounter (Signed)
Patient Name and DOB Verified.    Who called: patient    Where would you like to go: [facility]    What is the diagnose or reason for visit: psoriasis    Has the patient every seen this provider before: yes     Do they have an office/provider preference: [facility]    Any time or date preferences: updated referral      What is your current insurance: [employer] medicare  [phone] (home)   Stephen Meadows

## 2019-07-04 ENCOUNTER — Inpatient Hospital Stay: Admit: 2019-07-04 | Payer: MEDICARE | Primary: Family

## 2019-07-04 ENCOUNTER — Ambulatory Visit: Admit: 2019-07-04 | Discharge: 2019-07-04 | Payer: MEDICARE | Attending: Family | Primary: Family

## 2019-07-04 ENCOUNTER — Ambulatory Visit: Attending: Family

## 2019-07-04 DIAGNOSIS — E119 Type 2 diabetes mellitus without complications: Secondary | ICD-10-CM

## 2019-07-04 DIAGNOSIS — N3001 Acute cystitis with hematuria: Secondary | ICD-10-CM

## 2019-07-04 LAB — AMB POC URINALYSIS DIP STICK MANUAL W/O MICRO
Ketones (UA POC): NEGATIVE
Ketones, Urine, POC: NEGATIVE
Leukocyte Esterase, Urine, POC: NEGATIVE
Leukocyte esterase (UA POC): NEGATIVE
Nitrite, Urine, POC: POSITIVE
Nitrites (UA POC): POSITIVE
Specific Gravity, Urine, POC: 1.025 NA (ref 1.001–1.035)
Specific gravity (UA POC): 1.025 (ref 1.001–1.035)
Urobilinogen (UA POC): NORMAL (ref 0.2–1)
Urobilinogen, POC: NORMAL (ref 0.2–1)
pH (UA POC): 6 (ref 4.6–8.0)
pH, Urine, POC: 6 NA (ref 4.6–8.0)

## 2019-07-04 LAB — AMB POC HEMOGLOBIN A1C
Hemoglobin A1C, POC: 7.4 %
Hemoglobin A1c (POC): 7.4 %

## 2019-07-04 MED ORDER — METFORMIN SR 500 MG 24 HR TABLET
500 mg | ORAL_TABLET | Freq: Every day | ORAL | 5 refills | Status: AC
Start: 2019-07-04 — End: ?

## 2019-07-04 MED ORDER — METFORMIN SR 500 MG 24 HR TABLET
500 mg | ORAL_TABLET | Freq: Every day | ORAL | 5 refills | Status: DC
Start: 2019-07-04 — End: 2019-07-04

## 2019-07-04 MED ORDER — NITROFURANTOIN (25% MACROCRYSTAL FORM) 100 MG CAP
100 mg | ORAL_CAPSULE | Freq: Two times a day (BID) | ORAL | 0 refills | Status: AC
Start: 2019-07-04 — End: 2019-07-09

## 2019-07-04 NOTE — Assessment & Plan Note (Signed)
Possibly related to UTI or new diagnosis of T2DM. Repeat labs ordered.

## 2019-07-04 NOTE — Progress Notes (Signed)
[facility] [address]   [place] [id number]  [phone]    CHIEF COMPLAINT   Stephen Meadows is a 59 y.o. male who presents today for follow-up of Pre-diabetes.    HISTORY OF PRESENT ILLNESS     I had the pleasure of speaking with??Stephen Meadows today. He is a homeless 36 year old caucasian male with a history of??intermittent explosive disorder,??depression, psoriasis and complex regional pain syndrome. He presents for follow-up.     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 He decided he didn't want to sell his truck or camper and declined further services. He is still living in his [place]i at [employer] parking lot. [place] head [place] in the [place] before the weather gets bad. He says his mood is good now that he is back on Effexor. He denies any symptoms of depression. He sees some friends who stop by to check on him. He has plenty to eat. He has food stamps and shops at [employer]. He can cook in his camper. He is happy.     Decreased eGFR:  Stephen Meadows was in the ED recently for evaluation of leg cramps. He reported several months of the  ???skin occasionally sinking in??? around his medial right calf muscle. This causes him pain when it occurs. He was symptomatic while at the hospital and a nurse was able to observe muscle spasms in the right calf. His electrolytes were normal but his work-up was notable for a decrease in GFR. He was advised to follow-up here. He has a history of prediabetes. He denies any urinary symptoms. He denies having polyuria, polydipsia or polyphagia.  He is not checking blood sugars at home.  His A1C is 7.4% today.  Lab Results   Component Value Date/Time    GFR est AA >60 06/29/2019 03:02 PM    GFR est non-AA 57 (L) 06/29/2019 03:02 PM    Creatinine 1.31 (H) 06/29/2019 03:02 PM    BUN 18 06/29/2019 03:02 PM    Sodium 137 06/29/2019 03:02 PM    Potassium 4.1 06/29/2019 03:02 PM    Chloride 103 06/29/2019 03:02 PM    CO2 29 06/29/2019 03:02  PM     Lab Results   Component Value Date/Time    Hemoglobin A1c (POC) 7.4 07/04/2019 03:42 PM    Hemoglobin A1c (POC) 6.4 01/02/2019 01:33 PM     PHYSICAL EXAM     Constitutional:  Alert and oriented x3 with lucid speech, no acute distress.  Lungs:  Clear to auscultation.   Cardiovascular:  Regular rate/rhythm with normal S1-S2 and no murmurs.  GU: No suprapubic or CVA tenderness.    Extremities: extremities normal, atraumatic, no cyanosis or edema  Psych/Mental Status:  Calm, alert and cooperative. Mood and affect appropriate.    MEDICATIONS     Current Outpatient Medications   Medication Sig   ??? Blood-Glucose Meter monitoring kit Supply one touch ultra glucometer for Dx of Diabetes   ??? glucose blood VI test strips (ASCENSIA AUTODISC VI, ONE TOUCH ULTRA TEST VI) strip Testing DM 1-2 times per day as needed   ??? lancets misc Testing for diabetes 1-2 times daily as needed   ??? metFORMIN ER (GLUCOPHAGE XR) 500 mg tablet Take 1 Tablet by mouth daily (with dinner).   ??? nitrofurantoin, macrocrystal-monohydrate, (MACROBID) 100 mg capsule Take 1 Capsule by mouth two (2) times a day for 5 days. Indications: bacterial urinary tract infection   ???  venlafaxine (EFFEXOR) 37.5 mg tablet Take 1 Tab by mouth three (3) times daily. Indications: major depressive disorder   ??? gabapentin (NEURONTIN) 100 mg capsule Take 1 BID   ??? Shingrix, PF, 50 mcg/0.5 mL susr injection Administer 0.45m IM injection once now and a second injection in 2-6 months  Indications: shingles vaccination (Patient not taking: Reported on 07/04/2019)     No current facility-administered medications for this visit.     Medications Discontinued During This Encounter   Medication Reason   ??? metFORMIN ER (GLUCOPHAGE XR) 500 mg tablet REORDER       ALLERGIES   No Known Allergies    ACTIVE MEDICAL PROBLEMS     Patient Active Problem List   Diagnosis Code   ??? Moderate episode of recurrent major depressive disorder ([facility] F33.1   ??? Type 2 diabetes mellitus without  complication, without long-term current use of insulin (HCC) E11.9   ??? 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 Nocturia R35.1   ??? Anxiety FW29.5  ??? 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 At moderate risk for fall Z91.81   ??? Homelessness Z59.0   ??? Intermittent explosive disorder in adult F63.81   ??? Preventative health care Z00.00   ??? Tinea pedis of both feet B35.3   ??? Decreased GFR R94.4   ??? Acute cystitis with hematuria N30.01   ??? Leg cramps R25.2       SOCIAL HISTORY     Social History     Social History Narrative    Marital status: Married, LMickel Baas- 9 years    Living situation:     Pets in the home: 2 dogs, 1 cat     Children: None- 2 step children     Occupation: Disabled    Highest education: HS    Alcohol,tobacco, drug uAOZ:HYQMVHand marijuana use     Caffeine/ diet: Keto,  Decaf coffee    Exercise: None     Military- None                VITALS     Vitals:    07/04/19 1436   BP: 121/72   Pulse: 70   Resp: 16   Temp: 97.5 ??F (36.4 ??C)   TempSrc: Temporal     There is no height or weight on file to calculate BMI.    BP Readings from Last 3 Encounters:   07/04/19 121/72   06/29/19 131/72   02/02/19 (!) 127/92     Wt Readings from Last 3 Encounters:   06/29/19 175 lb (79.4 kg)   02/02/19 175 lb (79.4 kg)   01/29/19 175 lb (79.4 kg)       ASSESSMENT AND PLAN     Diagnoses and all orders for this visit:    1. Type 2 diabetes mellitus without complication, without long-term current use of insulin (HCC)  Assessment & Plan:  ?? This is a new diagnosis today.  We had long discussion month pathogenesis of type 2 diabetes and insulin resistance syndrome in the importance maintaining an A1C below 7% to prevent micro and macrovascular complications.  This patient is homeless and relies on food stamps and charitable contributions for his meals.  He purchases all of his food at [employer] cooks in his camper.  He is agreeable to a referral for diabetes education and nutrition.   ?? We discussed the importance of  avoiding fast foods, high fat and fried foods, processed foods, and  refined carbohydrates (white bread, rice, pasta, potatoes, sugar), and increasing his intake of plant based diet rich in greens, whole grains, healthy fats and lean proteins.   ?? Discussed the importance of regular aerobic exercise, working on weight loss, checking feet regularly, and annual eye exams.  ?? Glucometer and testing supplies have been sent to his pharmacy. He will bring them with him to his next appointment so he can learn to test his blood sugar.    ?? He will start treatment with metformin 500 mg daily.  Potential side effects reviewed.  ?? Follow-up two months.     Orders:  -     HEMOGLOBIN A1C WITH EAG; Future  -     MICROALBUMIN, UR, RAND; Future  -     LIPID PANEL W/ REFLX DIRECT LDL; Future  -     REFERRAL TO DIABETIC EDUCATION  -     REFERRAL TO NUTRITION  -     metFORMIN ER (GLUCOPHAGE XR) 500 mg tablet; Take 1 Tablet by mouth daily (with dinner).  -     AMB POC HEMOGLOBIN A1C    2. Decreased GFR  Assessment & Plan:  Possibly related to UTI or new diagnosis of T2DM. Repeat labs ordered.     Orders:  -     AMB POC URINALYSIS DIP STICK MANUAL W/O MICRO  -     METABOLIC PANEL, COMPREHENSIVE; Future  -     CBC W/O DIFF; Future    3. Acute cystitis with hematuria  Assessment & Plan:  Patient is asymptomatic but his urinalysis is positive for nitrites and trace blood and he had a decreased eGFR on recent labs. I'm treating empirically with Macrobid.  Urine will be sent for culture although he did not complete a clean catch as instructed. I will plan to follow-up with patient by phone to review the results and make further recommendations for evaluation and treatment as clinically indicated.  Given the presence of blood on his initial urine sample will plan to repeat the urinalysis completion of treatment to document resolution.    Orders:  -     URINALYSIS W/ REFLEX CULTURE; Future  -     CULTURE, URINE; Future  -     nitrofurantoin,  macrocrystal-monohydrate, (MACROBID) 100 mg capsule; Take 1 Capsule by mouth two (2) times a day for 5 days. Indications: bacterial urinary tract infection    4. Leg cramps  Assessment & Plan:  Patient was observed having leg cramps while in the emergency room.  His labs were reviewed showing normal electrolytes status. He was advised to increase his water intake blood and stretch his calves prior to going to bed.  We will check the magnesium level with his next set of labs.    Orders:  -     METABOLIC PANEL, BASIC; Future  -     MAGNESIUM; Future    5. Preventative health care  -     LIPID PANEL W/ REFLX DIRECT LDL; Future  -     HCV AB; Future  -     PSA SCREENING (SCREENING); Future    6. 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 He now has insurance and prefers to live out of his truck and camper He intends to drive [place] prior to winter to avoid harsh weather. He declines further assistance and says he has all he needs.       Other orders  -  Blood-Glucose Meter monitoring kit; Supply one touch ultra glucometer for Dx of Diabetes  -     glucose blood VI test strips (ASCENSIA AUTODISC VI, ONE TOUCH ULTRA TEST VI) strip; Testing DM 1-2 times per day as needed  -     lancets misc; Testing for diabetes 1-2 times daily as needed    Follow-up and Dispositions    ?? Return in 1 month (on 08/03/2019), or if symptoms worsen or fail to improve, for diabetes.       Greater than 45 minutes were spent during this consultation of that greater than 50% of the time was spent in direct coordination of care and patient education for the above listed problems.     Future Appointments   Date Time Provider [place]   09/07/2019  3:30 PM Fardeen Steinberger, Janace Hoard, FNP [facility]   01/15/2020 10:30 AM Dawnisha Marquina, Janace Hoard, Abeytas A Keshanna Riso, FNP  07/04/2019

## 2019-07-06 LAB — CULTURE, URINE
Colonies Counted: <10000
Colony Count: <10000

## 2019-07-09 MED ORDER — BLOOD SUGAR DIAGNOSTIC TEST STRIPS
ORAL_STRIP | 3 refills | Status: AC
Start: 2019-07-09 — End: ?

## 2019-07-09 MED ORDER — LANCETS
3 refills | Status: AC
Start: 2019-07-09 — End: ?

## 2019-07-09 MED ORDER — BLOOD GLUCOSE METER KIT
PACK | 0 refills | Status: AC
Start: 2019-07-09 — End: ?

## 2019-07-09 NOTE — Assessment & Plan Note (Signed)
Patient is asymptomatic but his urinalysis is positive for nitrites and trace blood and he had a decreased eGFR on recent labs. I'm treating empirically with Macrobid.  Urine will be sent for culture although he did not complete a clean catch as instructed. I will plan to follow-up with patient by phone to review the results and make further recommendations for evaluation and treatment as clinically indicated.  Given the presence of blood on his initial urine sample will plan to repeat the urinalysis completion of treatment to document resolution.

## 2019-07-09 NOTE — Assessment & Plan Note (Signed)
Unfortunately Stephen Meadows has decided not to work with Sports coach. He now has insurance and prefers to live out of his truck and camper He intends to drive south prior to winter to avoid harsh weather. He declines further assistance and says he has all he needs.

## 2019-07-09 NOTE — Assessment & Plan Note (Signed)
??   This is a new diagnosis today.  We had long discussion month pathogenesis of type 2 diabetes and insulin resistance syndrome in the importance maintaining an A1C below 7% to prevent micro and macrovascular complications.  This patient is homeless and relies on food stamps and charitable contributions for his meals.  He purchases all of his food at [employer] and cooks in his camper.  He is agreeable to a referral for diabetes education and nutrition.   ?? We discussed the importance of avoiding fast foods, high fat and fried foods, processed foods, and refined carbohydrates (white bread, rice, pasta, potatoes, sugar), and increasing his intake of plant based diet rich in greens, whole grains, healthy fats and lean proteins.   ?? Discussed the importance of regular aerobic exercise, working on weight loss, checking feet regularly, and annual eye exams.  ?? Glucometer and testing supplies have been sent to his pharmacy. He will bring them with him to his next appointment so he can learn to test his blood sugar.    ?? He will start treatment with metformin 500 mg daily.  Potential side effects reviewed.  ?? Follow-up two months.

## 2019-07-09 NOTE — Assessment & Plan Note (Signed)
Patient was observed having leg cramps while in the emergency room.  His labs were reviewed showing normal electrolytes status. He was advised to increase his water intake blood and stretch his calves prior to going to bed.  We will check the magnesium level with his next set of labs.

## 2019-09-07 ENCOUNTER — Ambulatory Visit: Payer: MEDICARE | Attending: Family | Primary: Family

## 2020-01-03 ENCOUNTER — Encounter: Attending: Family | Primary: Family

## 2020-01-15 ENCOUNTER — Encounter: Payer: MEDICARE | Attending: Family | Primary: Family

## 2020-01-15 NOTE — Telephone Encounter (Signed)
I attempted to contact patient today to discuss no shows; however, there was no answer.  No show letter sent.    Please address no show policy with patient when they call back and document the conversation in the chart.

## 2020-04-09 ENCOUNTER — Encounter

## 2020-04-09 NOTE — Telephone Encounter (Signed)
OMARIE GILBRETH  [birth year]      Call back needed: no    Preferred call back number: Call preference: Home phone   [phone] (home)    Telephone Information:   Mobile [phone]        Medications Requested:  Requested Prescriptions     Pending Prescriptions Disp Refills   . venlafaxine (EFFEXOR) 37.5 mg tablet 270 Tablet 3     Sig: Take 1 Tablet by mouth three (3) times daily. Indications: major depressive disorder           Preferred Pharmacy:       [facility] [address], [place] - [place] [address]  [address] [place] [address]  [place] [address]  Phone: [phone] Fax: [phone]                                           Last appt @ PCP Office: 01/15/2020    No future appointments.    MOST RECENT BLOOD PRESSURES  BP Readings from Last 3 Encounters:   07/04/19 121/72   06/29/19 131/72   02/02/19 (!) 127/92         MOST RECENT LAB DATA  Lab Results   Component Value Date/Time    Creatinine 1.31 (H) 06/29/2019 03:02 PM    Potassium 4.1 06/29/2019 03:02 PM    ALT (SGPT) 23 12/26/2018 01:33 PM    HGB 17.7 12/26/2018 01:33 PM    HCT 52.3 (H) 12/26/2018 01:33 PM    Hemoglobin A1c (POC) 7.4 07/04/2019 03:42 PM   Everlean Alstrom  [phone] (home)

## 2020-04-10 NOTE — Telephone Encounter (Signed)
Telephone Encounter by Esperanza Heir A at 04/10/20 1501                Author: Esperanza Heir A  Service: --  Author Type: Medical Assistant       Filed: 04/10/20 1505  Encounter Date: 04/09/2020  Status: Signed          Editor: Kaleen Odea (Medical Assistant)               PRESCRIPTION REFILL REQUEST      TO PROVIDER:  needs appointment and will call patient to schedule     Left message to call back. Please schedule follow up appointment at call back.       Medication(s) Requested:       Requested Prescriptions            Pending Prescriptions  Disp  Refills          ?  venlafaxine (EFFEXOR) 37.5 mg tablet  270 Tablet  3             Sig: Take 1 Tablet by mouth three (3) times daily. Indications: major depressive disorder            Last RF Date:  01/02/19   Last RF Quantity:  270x3      WOOD FAMILY PROTOCOL     SNRI Protocol Failed 04/09/2020  04:13 PM       Protocol Details     Visit with authorizing provider in past 62months or upcoming 90 days        Medication not refilled in past 45 days (1.5 months)              Preferred Pharmacy:    [id number] [place], [place] - [address]   [address]   [place] [address]   Phone: [phone] Fax: [phone]      [facility] [address] [place] - [address] [place] [address]   [address] [place] [address]   [place] [address]   Phone: [phone] Fax: [phone]         Last appt @ PCP Office: 01/15/2020    No future appointments.      MOST RECENT BLOOD PRESSURES     BP Readings from Last 3 Encounters:        07/04/19  121/72     06/29/19  131/72        02/02/19  (!) 127/92           MOST RECENT LAB DATA     Lab Results         Component  Value  Date/Time            Creatinine  1.31 (H)  06/29/2019 03:02 PM       Potassium  4.1  06/29/2019 03:02 PM       ALT (SGPT)  23  12/26/2018 01:33 PM       HGB  17.7  12/26/2018 01:33 PM       HCT  52.3 (H)  12/26/2018 01:33 PM            Hemoglobin A1c (POC)  7.4  07/04/2019 03:42 PM

## 2020-04-11 MED ORDER — VENLAFAXINE 37.5 MG TAB
37.5 mg | ORAL_TABLET | Freq: Three times a day (TID) | ORAL | 0 refills | Status: AC
Start: 2020-04-11 — End: ?

## 2020-04-11 NOTE — Telephone Encounter (Signed)
As he is still within one calendar year of his last visit I will refill this medication one time for 90 days. Please advise him to establish care with a PCP in [place].

## 2020-04-11 NOTE — Telephone Encounter (Signed)
Pt name and DOB verified.    Pt returning call. Pt stated that he is stuck in [place] without his medication. Pt stated that he has no way of getting to an appt. Pt refused to schedule an appt and disconnected the line. Please advise.    [phone] (home)   Pablo Lawrence Hatchcock

## 2020-04-11 NOTE — Telephone Encounter (Signed)
Robin, please see the note below and advise on refill.

## 2020-04-14 NOTE — Telephone Encounter (Signed)
Done.    Stephen Meadows

## 2020-04-14 NOTE — Telephone Encounter (Signed)
Pt name and DOB verified.  Pt is returning [name]'s call. Please call pt back at [phone] (home)  (he is requesting a call back. Would like to speak with Clinical staff.)  Stephen Meadows
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
## 2020-04-14 NOTE — Telephone Encounter (Signed)
Thank you.

## 2020-04-14 NOTE — Telephone Encounter (Signed)
I called Stephen Meadows and left him a detailed VM message to notify him of PCP's refill response.

## 2020-04-16 ENCOUNTER — Encounter: Payer: Self-pay | Admitting: Nurse Practitioner

## 2020-04-16 ENCOUNTER — Ambulatory Visit (INDEPENDENT_AMBULATORY_CARE_PROVIDER_SITE_OTHER): Payer: Medicare Other | Admitting: Nurse Practitioner

## 2020-04-16 ENCOUNTER — Other Ambulatory Visit: Payer: Self-pay

## 2020-04-16 VITALS — BP 116/71 | HR 97 | Temp 97.1°F | Ht 69.0 in | Wt 157.0 lb

## 2020-04-16 DIAGNOSIS — F339 Major depressive disorder, recurrent, unspecified: Secondary | ICD-10-CM

## 2020-04-16 MED ORDER — VENLAFAXINE HCL 37.5 MG PO TABS: 37.5000 mg | ORAL_TABLET | Freq: Every day | ORAL | 1 refills | Status: DC

## 2020-04-16 NOTE — Patient Instructions (Signed)

## 2020-04-16 NOTE — Progress Notes (Signed)
New Patient Note  RE: Timothy Blackwell MRN: [id number] DOB: 07-Sep-1960 Date of Office Visit: 04/16/2020  Chief Complaint: Establish Care and Depression  History of Present Illness:  Depression: Patient complains of depression. He complains of depressed mood. Onset was approximately several years ago, gradually improving since that time.  He denies current suicidal and homicidal plan or intent.   Family history significant for no psychiatric illness.Possible organic causes contributing are: none.  Risk factors: previous episode of depression Previous treatment includes Effexor. He complains of the following side effects from the treatment: none.  Assessment and Plan: Timothy Blackwell is a 60 y.o. male with: No problem-specific Assessment & Plan notes found for this encounter.  Return if symptoms worsen or fail to improve.   Diagnostics:   Past Medical History: Patient Active Problem List   Diagnosis Date Noted  . Depression, recurrent (HCC) 04/16/2020   Past Medical History:  Diagnosis Date  . Depression    Past Surgical History:  Medication List:  Current Outpatient Medications  Medication Sig Dispense Refill  . venlafaxine (EFFEXOR) 37.5 MG tablet Take 37.5 mg by mouth daily.     No current facility-administered medications for this visit.   Allergies: Not on File Social History: Social History   Socioeconomic History  . Marital status: Married    Spouse name: Not on file  . Number of children: Not on file  . Years of education: Not on file  . Highest education level: Not on file  Occupational History  . Not on file  Tobacco Use  . Smoking status: Current Every Day Smoker    Packs/day: 0.50    Types: Cigarettes  . Smokeless tobacco: Never Used  Substance and Sexual Activity  . Alcohol use: Not Currently  . Drug use: Yes    Types: Marijuana, Hydrocodone  . Sexual activity: Not on file  Other Topics Concern  . Not on file  Social History Narrative  . Not on file    Social Determinants of Health   Financial Resource Strain: Not on file  Food Insecurity: Not on file  Transportation Needs: Not on file  Physical Activity: Not on file  Stress: Not on file  Social Connections: Not on file       Family History: No family history on file.       Review of Systems  Constitutional: Negative.   HENT: Negative.   Respiratory: Negative.   Cardiovascular: Negative.   Gastrointestinal: Negative.   Musculoskeletal: Negative.   Psychiatric/Behavioral: Negative for self-injury, sleep disturbance and suicidal ideas.       Positive for depression  All other systems reviewed and are negative.  Objective: BP 116/71   Pulse 97   Temp (!) 97.1 F (36.2 C) (Temporal)   Ht 5\' 9"  (1.753 m)   Wt 157 lb (71.2 kg)   SpO2 97%   BMI 23.18 kg/m  Body mass index is 23.18 kg/m. Physical Exam Vitals reviewed.  Constitutional:      Appearance: Normal appearance.  HENT:     Head: Normocephalic.     Nose: Nose normal.  Cardiovascular:     Rate and Rhythm: Normal rate and regular rhythm.     Pulses: Normal pulses.     Heart sounds: Normal heart sounds.  Pulmonary:     Effort: Pulmonary effort is normal.     Breath sounds: Normal breath sounds.  Abdominal:     General: Bowel sounds are normal.  Musculoskeletal:  General: Normal range of motion.     Cervical back: Normal range of motion.  Skin:    General: Skin is dry.  Neurological:     Mental Status: He is alert and oriented to person, place, and time.  Psychiatric:        Behavior: Behavior normal.    The plan was reviewed with the patient/family, and all questions/concerned were addressed.  It was my pleasure to see Timothy Blackwell today and participate in his care. Please feel free to contact me with any questions or concerns.  Sincerely,  Lynnell Chad NP [facility]

## 2020-06-30 NOTE — Telephone Encounter (Signed)
Provider has told pt they should go to [facility]. [facility] is closing and the other office is too far away. This referral isn't going to work. May I close

## 2020-07-01 NOTE — Telephone Encounter (Signed)
Yes. I believe this patient has [place] out of state. Referral closed.

## 2020-11-24 ENCOUNTER — Ambulatory Visit (INDEPENDENT_AMBULATORY_CARE_PROVIDER_SITE_OTHER): Payer: Medicare Other | Admitting: Nurse Practitioner

## 2020-11-24 ENCOUNTER — Other Ambulatory Visit: Payer: Self-pay

## 2020-11-24 ENCOUNTER — Encounter: Payer: Self-pay | Admitting: Nurse Practitioner

## 2020-11-24 VITALS — BP 129/70 | HR 85 | Temp 97.9°F | Ht 68.0 in | Wt 174.0 lb

## 2020-11-24 DIAGNOSIS — F339 Major depressive disorder, recurrent, unspecified: Secondary | ICD-10-CM | POA: Diagnosis not present

## 2020-11-24 DIAGNOSIS — F419 Anxiety disorder, unspecified: Secondary | ICD-10-CM

## 2020-11-24 DIAGNOSIS — Z23 Encounter for immunization: Secondary | ICD-10-CM

## 2020-11-24 MED ORDER — VENLAFAXINE HCL 37.5 MG PO TABS: 37.5000 mg | ORAL_TABLET | Freq: Every day | ORAL | 5 refills | Status: DC

## 2020-11-24 MED ORDER — VENLAFAXINE HCL 37.5 MG PO TABS: 37.5000 mg | ORAL_TABLET | Freq: Every day | ORAL | 1 refills | Status: DC

## 2020-11-24 NOTE — Progress Notes (Signed)
Established Patient Office Visit  Subjective:  Patient ID: Timothy Blackwell, male    DOB: 19-Nov-1960  Age: 60 y.o. MRN: [id number]  CC:  Chief Complaint  Patient presents with   Medication Management    HPI Timothy Blackwell presents for Depression, Follow-up  He  was last seen for this 6 months ago. Changes made at last visit include no medication changes made since last visit.   He reports excellent compliance with treatment. He is not having side effects.    He reports excellent tolerance of treatment. Current symptoms include: fatigue and feelings of worthlessness/guilt He feels he is Improved since last visit.  Depression screen [facility] 2/9 11/24/2020 04/16/2020  Decreased Interest 0 3  Down, Depressed, Hopeless 1 3  PHQ - 2 Score 1 6  Altered sleeping 1 0  Tired, decreased energy 0 0  Change in appetite 0 1  Feeling bad or failure about yourself  0 0  Trouble concentrating 0 2  Moving slowly or fidgety/restless 0 0  Suicidal thoughts 0 0  PHQ-9 Score 2 9  Difficult doing work/chores Somewhat difficult Somewhat difficult      Anxiety, Follow-up  He was last seen for anxiety 6 months ago. Changes made at last visit include no changes to current medication dose.   He reports excellent compliance with treatment. He reports excellent tolerance of treatment. He is not having side effects.   He feels his anxiety is mild and Improved since last visit.  Symptoms: No chest pain No difficulty concentrating  No dizziness No fatigue  Yes feelings of losing control No insomnia  No irritable No palpitations  No panic attacks No racing thoughts  No shortness of breath No sweating  No tremors/shakes    GAD-7 Results GAD-7 Generalized Anxiety Disorder Screening Tool 11/24/2020  1. Feeling Nervous, Anxious, or on Edge 2  2. Not Being Able to Stop or Control Worrying 1  3. Worrying Too Much About Different Things 0  4. Trouble Relaxing 1  5. Being So Restless it's Hard  To Sit Still 2  6. Becoming Easily Annoyed or Irritable 0  7. Feeling Afraid As If Something Awful Might Happen 0  Total GAD-7 Score 6  Difficulty At Work, Home, or Getting  Along With Others? Somewhat difficult    PHQ-9 Scores PHQ9 SCORE ONLY 11/24/2020 04/16/2020  PHQ-9 Total Score 2 9       Past Medical History:  Diagnosis Date   Depression     History reviewed. No pertinent surgical history.  History reviewed. No pertinent family history.  Social History   Socioeconomic History   Marital status: Married    Spouse name: Not on file   Number of children: Not on file   Years of education: Not on file   Highest education level: Not on file  Occupational History   Not on file  Tobacco Use   Smoking status: Every Day    Packs/day: 0.50    Types: Cigarettes   Smokeless tobacco: Never  Substance and Sexual Activity   Alcohol use: Not Currently   Drug use: Yes    Types: Marijuana, Hydrocodone   Sexual activity: Not on file  Other Topics Concern   Not on file  Social History Narrative   Not on file   Social Determinants of Health   Financial Resource Strain: Not on file  Food Insecurity: Not on file  Transportation Needs: Not on file  Physical Activity: Not on file  Stress: Not  on file  Social Connections: Not on file  Intimate Partner Violence: Not on file    Outpatient Medications Prior to Visit  Medication Sig Dispense Refill   venlafaxine (EFFEXOR) 37.5 MG tablet Take 1 tablet (37.5 mg total) by mouth daily. 30 tablet 1   No facility-administered medications prior to visit.    Not on File  ROS Review of Systems  Constitutional: Negative.   HENT: Negative.    Eyes: Negative.   Respiratory: Negative.    Cardiovascular: Negative.   Gastrointestinal: Negative.   Musculoskeletal: Negative.   Skin: Negative.  Negative for rash.  Psychiatric/Behavioral:  The patient is nervous/anxious.   All other systems reviewed and are negative.     Objective:    Physical Exam Vitals and nursing note reviewed.  Constitutional:      Appearance: Normal appearance. He is normal weight.  HENT:     Head: Normocephalic.     Right Ear: External ear normal.     Left Ear: External ear normal.     Nose: Nose normal.     Mouth/Throat:     Mouth: Mucous membranes are moist.     Pharynx: Oropharynx is clear.  Eyes:     Conjunctiva/sclera: Conjunctivae normal.  Cardiovascular:     Rate and Rhythm: Normal rate and regular rhythm.     Pulses: Normal pulses.     Heart sounds: Normal heart sounds.  Pulmonary:     Effort: Pulmonary effort is normal.     Breath sounds: Normal breath sounds.  Abdominal:     General: Bowel sounds are normal.  Skin:    General: Skin is warm.     Findings: No rash.  Neurological:     Mental Status: He is alert and oriented to person, place, and time.  Psychiatric:        Attention and Perception: Attention and perception normal.        Mood and Affect: Mood is anxious and depressed.        Speech: Speech normal.        Behavior: Behavior is uncooperative.        Thought Content: Thought content normal.    BP 129/70   Pulse 85   Temp 97.9 F (36.6 C)   Ht 5\' 8"  (1.727 m)   Wt 174 lb (78.9 kg)   SpO2 97%   BMI 26.46 kg/m  Wt Readings from Last 3 Encounters:  11/24/20 174 lb (78.9 kg)  04/16/20 157 lb (71.2 kg)     Health Maintenance Due  Topic Date Due   COVID-19 Vaccine (1) Never done   Pneumococcal Vaccine 50-75 Years old (1 - PCV) Never done   HIV Screening  Never done   Hepatitis C Screening  Never done   TETANUS/TDAP  Never done   COLONOSCOPY (Pts 45-69yrs Insurance coverage will need to be confirmed)  Never done   Zoster Vaccines- Shingrix (1 of 2) Never done   INFLUENZA VACCINE  08/04/2020        Assessment & Plan:   Problem List Items Addressed This Visit       Other   Depression, recurrent (HCC)    Depression symptoms well controlled on Effexor 37.5 mg tablet by mouth  daily.  Completed PHQ-9 education provided to patient printed handouts given.  Follow-up in 6 months.  Rx refill sent to pharmacy.      Relevant Medications   venlafaxine (EFFEXOR) 37.5 MG tablet   Anxiety - Primary  Symptoms well controlled no changes to current medication.  Completed GAD-7.  Follow-up in 6 months.      Relevant Medications   venlafaxine (EFFEXOR) 37.5 MG tablet    Meds ordered this encounter  Medications   DISCONTD: venlafaxine (EFFEXOR) 37.5 MG tablet    Sig: Take 1 tablet (37.5 mg total) by mouth daily.    Dispense:  30 tablet    Refill:  1    Order Specific Question:   Supervising Provider    Answer:   Mechele Claude [[id number]]   venlafaxine (EFFEXOR) 37.5 MG tablet    Sig: Take 1 tablet (37.5 mg total) by mouth daily.    Dispense:  30 tablet    Refill:  5    Order Specific Question:   Supervising Provider    Answer:   Mechele Claude [phone]    Follow-up: Return in about 6 months (around 05/24/2021), or if symptoms worsen or fail to improve.    Daryll Drown, NP

## 2020-11-24 NOTE — Patient Instructions (Addendum)
Generalized Anxiety Disorder, Adult Generalized anxiety disorder (GAD) is a mental health condition. Unlike normal worries, anxiety related to GAD is not triggered by a specific event. These worries do not fade or get better with time. GAD interferes with relationships, work, and school. GAD symptoms can vary from mild to severe. People with severe GAD can have intense waves of anxiety with physical symptoms that are similar to panic attacks. What are the causes? The exact cause of GAD is not known, but the following are believed to have an impact: Differences in natural brain chemicals. Genes passed down from parents to children. Differences in the way threats are perceived. Development and stress during childhood. Personality. What increases the risk? The following factors may make you more likely to develop this condition: Being male. Having a family history of anxiety disorders. Being very shy. Experiencing very stressful life events, such as the death of a loved one. Having a very stressful family environment. What are the signs or symptoms? People with GAD often worry excessively about many things in their lives, such as their health and family. Symptoms may also include: Mental and emotional symptoms: Worrying excessively about natural disasters. Fear of being late. Difficulty concentrating. Fears that others are judging your performance. Physical symptoms: Fatigue. Headaches, muscle tension, muscle twitches, trembling, or feeling shaky. Feeling like your heart is pounding or beating very fast. Feeling out of breath or like you cannot take a deep breath. Having trouble falling asleep or staying asleep, or experiencing restlessness. Sweating. Nausea, diarrhea, or irritable bowel syndrome (IBS). Behavioral symptoms: Experiencing erratic moods or irritability. Avoidance of new situations. Avoidance of people. Extreme difficulty making decisions. How is this diagnosed? This  condition is diagnosed based on your symptoms and medical history. You will also have a physical exam. Your health care provider may perform tests to rule out other possible causes of your symptoms. To be diagnosed with GAD, a person must have anxiety that: Is out of his or her control. Affects several different aspects of his or her life, such as work and relationships. Causes distress that makes him or her unable to take part in normal activities. Includes at least three symptoms of GAD, such as restlessness, fatigue, trouble concentrating, irritability, muscle tension, or sleep problems. Before your health care provider can confirm a diagnosis of GAD, these symptoms must be present more days than they are not, and they must last for 6 months or longer. How is this treated? This condition may be treated with: Medicine. Antidepressant medicine is usually prescribed for Certain-term daily control. Anti-anxiety medicines may be added in severe cases, especially when panic attacks occur. Talk therapy (psychotherapy). Certain types of talk therapy can be helpful in treating GAD by providing support, education, and guidance. Options include: Cognitive behavioral therapy (CBT). People learn coping skills and self-calming techniques to ease their physical symptoms. They learn to identify unrealistic thoughts and behaviors and to replace them with more appropriate thoughts and behaviors. Acceptance and commitment therapy (ACT). This treatment teaches people how to be mindful as a way to cope with unwanted thoughts and feelings. Biofeedback. This process trains you to manage your body's response (physiological response) through breathing techniques and relaxation methods. You will work with a therapist while machines are used to monitor your physical symptoms. Stress management techniques. These include yoga, meditation, and exercise. A mental health specialist can help determine which treatment is best for you.  Some people see improvement with one type of therapy. However, other people require   a combination of therapies. Follow these instructions at home: Lifestyle Maintain a consistent routine and schedule. Anticipate stressful situations. Create a plan and allow extra time to work with your plan. Practice stress management or self-calming techniques that you have learned from your therapist or your health care provider. Exercise regularly and spend time outdoors. Eat a healthy diet that includes plenty of vegetables, fruits, whole grains, low-fat dairy products, and lean protein. Do not eat a lot of foods that are high in fat, added sugar, or salt (sodium). Drink plenty of water. Avoid alcohol. Alcohol can increase anxiety. Avoid caffeine and certain over-the-counter cold medicines. These may make you feel worse. Ask your pharmacist which medicines to avoid. General instructions Take over-the-counter and prescription medicines only as told by your health care provider. Understand that you are likely to have setbacks. Accept this and be kind to yourself as you persist to take better care of yourself. Anticipate stressful situations. Create a plan and allow extra time to work with your plan. Recognize and accept your accomplishments, even if you judge them as small. Spend time with people who care about you. Keep all follow-up visits. This is important. Where to find more information [employer] of Mental Health: http://www.maynard.net/ Substance Abuse and Mental Health Services: SkateOasis.com.pt Contact a health care provider if: Your symptoms do not get better. Your symptoms get worse. You have signs of depression, such as: A persistently sad or irritable mood. Loss of enjoyment in activities that used to bring you joy. Change in weight or eating. Changes in sleeping habits. Get help right away if: You have thoughts about hurting yourself or others. If you ever feel like you may hurt  yourself or others, or have thoughts about taking your own life, get help right away. Go to your nearest emergency department or: Call your local emergency services (911 in the [place] Call a suicide crisis helpline, such as the National Suicide Prevention Lifeline at (669)656-4849 or 988 in the [place] This is open 24 hours a day in the [place] Text the Crisis Text Line at (207) 191-0208 (in the [place] Summary Generalized anxiety disorder (GAD) is a mental health condition that involves worry that is not triggered by a specific event. People with GAD often worry excessively about many things in their lives, such as their health and family. GAD may cause symptoms such as restlessness, trouble concentrating, sleep problems, frequent sweating, nausea, diarrhea, headaches, and trembling or muscle twitching. A mental health specialist can help determine which treatment is best for you. Some people see improvement with one type of therapy. However, other people require a combination of therapies. This information is not intended to replace advice given to you by your health care provider. Make sure you discuss any questions you have with your health care provider. Document Revised: 07/16/2020 Document Reviewed: 04/13/2020 Elsevier Patient Education  2022 Else[employer] Inc. Managing Depression, Adult Depression is a mental health condition that affects your thoughts, feelings, and actions. Being diagnosed with depression can bring you relief if you did not know why you have felt or behaved a certain way. It could also leave you feeling overwhelmed with uncertainty about your future. Preparing yourself to manage your symptoms can help you feel more positive about your future. How to manage lifestyle changes Managing stress Stress is your body's reaction to life changes and events, both good and bad. Stress can add to your feelings of depression. Learning to manage your stress can help lessen your feelings of depression. Try  some of the following approaches to reducing your stress (stress reduction techniques): Listen to music that you enjoy and that inspires you. Try using a meditation app or take a meditation class. Develop a practice that helps you connect with your spiritual self. Walk in nature, pray, or go to a place of worship. Do some deep breathing. To do this, inhale slowly through your nose. Pause at the top of your inhale for a few seconds and then exhale slowly, letting your muscles relax. Practice yoga to help relax and work your muscles. Choose a stress reduction technique that suits your lifestyle and personality. These techniques take time and practice to develop. Set aside 5-15 minutes a day to do them. Therapists can offer training in these techniques. Other things you can do to manage stress include: Keeping a stress diary. Knowing your limits and saying no when you think something is too much. Paying attention to how you react to certain situations. You may not be able to control everything, but you can change your reaction. Adding humor to your life by watching funny films or TV shows. Making time for activities that you enjoy and that relax you.  Medicines Medicines, such as antidepressants, are often a part of treatment for depression. Talk with your pharmacist or health care provider about all the medicines, supplements, and herbal products that you take, their possible side effects, and what medicines and other products are safe to take together. Make sure to report any side effects you may have to your health care provider. Relationships Your health care provider may suggest family therapy, couples therapy, or individual therapy as part of your treatment. How to recognize changes Everyone responds differently to treatment for depression. As you recover from depression, you may start to: Have more interest in doing activities. Feel less hopeless. Have more energy. Overeat less often, or  have a better appetite. Have better mental focus. It is important to recognize if your depression is not getting better or is getting worse. The symptoms you had in the beginning may return, such as: Tiredness (fatigue) or low energy. Eating too much or too little. Sleeping too much or too little. Feeling restless, agitated, or hopeless. Trouble focusing or making decisions. Unexplained physical complaints. Feeling irritable, angry, or aggressive. If you or your family members notice these symptoms coming back, let your health care provider know right away. Follow these instructions at home: Activity  Try to get some form of exercise each day, such as walking, biking, swimming, or lifting weights. Practice stress reduction techniques. Engage your mind by taking a class or doing some volunteer work. Lifestyle Get the right amount and quality of sleep. Cut down on using caffeine, tobacco, alcohol, and other potentially harmful substances. Eat a healthy diet that includes plenty of vegetables, fruits, whole grains, low-fat dairy products, and lean protein. Do not eat a lot of foods that are high in solid fats, added sugars, or salt (sodium). General instructions Take over-the-counter and prescription medicines only as told by your health care provider. Keep all follow-up visits as told by your health care provider. This is important. Where to find support Talking to others Friends and family members can be sources of support and guidance. Talk to trusted friends or family members about your condition. Explain your symptoms to them, and let them know that you are working with a health care provider to treat your depression. Tell friends and family members how they also can be helpful. Finances Find appropriate mental  health providers that fit with your financial situation. Talk with your health care provider about options to get reduced prices on your medicines. Where to find more  information You can find support in your area from: Anxiety and Depression Association of America (ADAA): www.adaa.org Mental Health America: www.mentalhealthamerica.net [employer] on Mental Illness: www.nami.org Contact a health care provider if: You stop taking your antidepressant medicines, and you have any of these symptoms: Nausea. Headache. Light-headedness. Chills and body aches. Not being able to sleep (insomnia). You or your friends and family think your depression is getting worse. Get help right away if: You have thoughts of hurting yourself or others. If you ever feel like you may hurt yourself or others, or have thoughts about taking your own life, get help right away. Go to your nearest emergency department or: Call your local emergency services (911 in the [place] Call a suicide crisis helpline, such as the National Suicide Prevention Lifeline at 747-238-7881 or 988 in the [place] This is open 24 hours a day in the [place] Text the Crisis Text Line at (781)411-8557 (in the [place] Summary If you are diagnosed with depression, preparing yourself to manage your symptoms is a good way to feel positive about your future. Work with your health care provider on a management plan that includes stress reduction techniques, medicines (if applicable), therapy, and healthy lifestyle habits. Keep talking with your health care provider about how your treatment is working. If you have thoughts about taking your own life, call a suicide crisis helpline or text a crisis text line. This information is not intended to replace advice given to you by your health care provider. Make sure you discuss any questions you have with your health care provider. Document Revised: 07/16/2020 Document Reviewed: 11/01/2018 Elsevier Patient Education  2022 [employer].

## 2020-11-24 NOTE — Assessment & Plan Note (Signed)
Symptoms well controlled no changes to current medication.  Completed GAD-7.  Follow-up in 6 months.

## 2020-11-24 NOTE — Assessment & Plan Note (Signed)
Depression symptoms well controlled on Effexor 37.5 mg tablet by mouth daily.  Completed PHQ-9 education provided to patient printed handouts given.  Follow-up in 6 months.  Rx refill sent to pharmacy.

## 2020-12-15 ENCOUNTER — Ambulatory Visit (INDEPENDENT_AMBULATORY_CARE_PROVIDER_SITE_OTHER): Payer: Medicare Other

## 2020-12-15 VITALS — Ht 68.0 in | Wt 175.0 lb

## 2020-12-15 DIAGNOSIS — Z0000 Encounter for general adult medical examination without abnormal findings: Secondary | ICD-10-CM

## 2020-12-15 DIAGNOSIS — Z748 Other problems related to care provider dependency: Secondary | ICD-10-CM

## 2020-12-15 NOTE — Patient Instructions (Signed)
Mr. Timothy Blackwell , Thank you for taking time to come for your Medicare Wellness Visit. I appreciate your ongoing commitment to your health goals. Please review the following plan we discussed and let me know if I can assist you in the future.   Screening recommendations/referrals: Colonoscopy: Due - let us know when you are ready to schedule this Recommended yearly ophthalmology/optometry visit for glaucoma screening and checkup Recommended yearly dental visit for hygiene and checkup  Vaccinations: Influenza vaccine: Done 11/24/2020 - Repeat annually Pneumococcal vaccine: Due Tdap vaccine: Due Shingles vaccine: Due   Covid-19: Due  Advanced directives: Advance directive discussed with you today. Even though you declined this today, please call our office should you change your mind, and we can give you the proper paperwork for you to fill out.   Conditions/risks identified: Aim for 30 minutes of exercise or brisk walking each day, drink 6-8 glasses of water and eat lots of fruits and vegetables.   Next appointment: Follow up in one year for your annual wellness visit   Preventive Care 40-64 Years, Male Preventive care refers to lifestyle choices and visits with your health care provider that can promote health and wellness. What does preventive care include? A yearly physical exam. This is also called an annual well check. Dental exams once or twice a year. Routine eye exams. Ask your health care provider how often you should have your eyes checked. Personal lifestyle choices, including: Daily care of your teeth and gums. Regular physical activity. Eating a healthy diet. Avoiding tobacco and drug use. Limiting alcohol use. Practicing safe sex. Taking low-dose aspirin every day starting at age 6. What happens during an annual well check? The services and screenings done by your health care provider during your annual well check will depend on your age, overall health, lifestyle risk  factors, and family history of disease. Counseling  Your health care provider may ask you questions about your: Alcohol use. Tobacco use. Drug use. Emotional well-being. Home and relationship well-being. Sexual activity. Eating habits. Work and work Astronomer. Screening  You may have the following tests or measurements: Height, weight, and BMI. Blood pressure. Lipid and cholesterol levels. These may be checked every 5 years, or more frequently if you are over 23 years old. Skin check. Lung cancer screening. You may have this screening every year starting at age 81 if you have a 30-pack-year history of smoking and currently smoke or have quit within the past 15 years. Fecal occult blood test (FOBT) of the stool. You may have this test every year starting at age 10. Flexible sigmoidoscopy or colonoscopy. You may have a sigmoidoscopy every 5 years or a colonoscopy every 10 years starting at age 72. Prostate cancer screening. Recommendations will vary depending on your family history and other risks. Hepatitis C blood test. Hepatitis B blood test. Sexually transmitted disease (STD) testing. Diabetes screening. This is done by checking your blood sugar (glucose) after you have not eaten for a while (fasting). You may have this done every 1-3 years. Discuss your test results, treatment options, and if necessary, the need for more tests with your health care provider. Vaccines  Your health care provider may recommend certain vaccines, such as: Influenza vaccine. This is recommended every year. Tetanus, diphtheria, and acellular pertussis (Tdap, Td) vaccine. You may need a Td booster every 10 years. Zoster vaccine. You may need this after age 52. Pneumococcal 13-valent conjugate (PCV13) vaccine. You may need this if you have certain conditions and have not  been vaccinated. Pneumococcal polysaccharide (PPSV23) vaccine. You may need one or two doses if you smoke cigarettes or if you have  certain conditions. Talk to your health care provider about which screenings and vaccines you need and how often you need them. This information is not intended to replace advice given to you by your health care provider. Make sure you discuss any questions you have with your health care provider. Document Released: 01/17/2015 Document Revised: 09/10/2015 Document Reviewed: 10/22/2014 Elsevier Interactive Patient Education  2017 [employer].  Fall Prevention in the Home Falls can cause injuries. They can happen to people of all ages. There are many things you can do to make your home safe and to help prevent falls. What can I do on the outside of my home? Regularly fix the edges of walkways and driveways and fix any cracks. Remove anything that might make you trip as you walk through a door, such as a raised step or threshold. Trim any bushes or trees on the path to your home. Use bright outdoor lighting. Clear any walking paths of anything that might make someone trip, such as rocks or tools. Regularly check to see if handrails are loose or broken. Make sure that both sides of any steps have handrails. Any raised decks and porches should have guardrails on the edges. Have any leaves, snow, or ice cleared regularly. Use sand or salt on walking paths during winter. Clean up any spills in your garage right away. This includes oil or grease spills. What can I do in the bathroom? Use night lights. Install grab bars by the toilet and in the tub and shower. Do not use towel bars as grab bars. Use non-skid mats or decals in the tub or shower. If you need to sit down in the shower, use a plastic, non-slip stool. Keep the floor dry. Clean up any water that spills on the floor as soon as it happens. Remove soap buildup in the tub or shower regularly. Attach bath mats securely with double-sided non-slip rug tape. Do not have throw rugs and other things on the floor that can make you trip. What can  I do in the bedroom? Use night lights. Make sure that you have a light by your bed that is easy to reach. Do not use any sheets or blankets that are too big for your bed. They should not hang down onto the floor. Have a firm chair that has side arms. You can use this for support while you get dressed. Do not have throw rugs and other things on the floor that can make you trip. What can I do in the kitchen? Clean up any spills right away. Avoid walking on wet floors. Keep items that you use a lot in easy-to-reach places. If you need to reach something above you, use a strong step stool that has a grab bar. Keep electrical cords out of the way. Do not use floor polish or wax that makes floors slippery. If you must use wax, use non-skid floor wax. Do not have throw rugs and other things on the floor that can make you trip. What can I do with my stairs? Do not leave any items on the stairs. Make sure that there are handrails on both sides of the stairs and use them. Fix handrails that are broken or loose. Make sure that handrails are as long as the stairways. Check any carpeting to make sure that it is firmly attached to the stairs. Fix any  carpet that is loose or worn. Avoid having throw rugs at the top or bottom of the stairs. If you do have throw rugs, attach them to the floor with carpet tape. Make sure that you have a light switch at the top of the stairs and the bottom of the stairs. If you do not have them, ask someone to add them for you. What else can I do to help prevent falls? Wear shoes that: Do not have high heels. Have rubber bottoms. Are comfortable and fit you well. Are closed at the toe. Do not wear sandals. If you use a stepladder: Make sure that it is fully opened. Do not climb a closed stepladder. Make sure that both sides of the stepladder are locked into place. Ask someone to hold it for you, if possible. Clearly mark and make sure that you can see: Any grab bars or  handrails. First and last steps. Where the edge of each step is. Use tools that help you move around (mobility aids) if they are needed. These include: Canes. Walkers. Scooters. Crutches. Turn on the lights when you go into a dark area. Replace any light bulbs as soon as they burn out. Set up your furniture so you have a clear path. Avoid moving your furniture around. If any of your floors are uneven, fix them. If there are any pets around you, be aware of where they are. Review your medicines with your doctor. Some medicines can make you feel dizzy. This can increase your chance of falling. Ask your doctor what other things that you can do to help prevent falls. This information is not intended to replace advice given to you by your health care provider. Make sure you discuss any questions you have with your health care provider. Document Released: 10/17/2008 Document Revised: 05/29/2015 Document Reviewed: 01/25/2014 Elsevier Interactive Patient Education  2017 [employer].

## 2020-12-15 NOTE — Progress Notes (Signed)
Subjective:   Timothy Blackwell is a 60 y.o. male who presents for an Initial Medicare Annual Wellness Visit.  Virtual Visit via Telephone Note  I connected with  Timothy Blackwell on 12/15/20 at  4:15 PM EST by telephone and verified that I am speaking with the correct person using two identifiers.  Location: Patient: Home Provider: [facility] Persons participating in the virtual visit: patient/Nurse Health Advisor   I discussed the limitations, risks, security and privacy concerns of performing an evaluation and management service by telephone and the availability of in person appointments. The patient expressed understanding and agreed to proceed.  Interactive audio and video telecommunications were attempted between this nurse and patient, however failed, due to patient having technical difficulties OR patient did not have access to video capability.  We continued and completed visit with audio only.  Some vital signs may be absent or patient reported.   Jaequan Propes E Tigerlily Christine, LPN   Review of Systems     Cardiac Risk Factors include: advanced age (>68men, >52 women);male gender;smoking/ tobacco exposure     Objective:    Today's Vitals   12/15/20 1618  Weight: 175 lb (79.4 kg)  Height: 5\' 8"  (1.727 m)   Body mass index is 26.61 kg/m.  Advanced Directives 12/15/2020  Does Patient Have a Medical Advance Directive? No  Would patient like information on creating a medical advance directive? No - Patient declined    Current Medications (verified) Outpatient Encounter Medications as of 12/15/2020  Medication Sig   venlafaxine (EFFEXOR) 37.5 MG tablet Take 1 tablet (37.5 mg total) by mouth daily.   No facility-administered encounter medications on file as of 12/15/2020.    Allergies (verified) Patient has no allergy information on record.   History: Past Medical History:  Diagnosis Date   Depression    History reviewed. No pertinent surgical history. History reviewed. No  pertinent family history. Social History   Socioeconomic History   Marital status: Married    Spouse name: Not on file   Number of children: Not on file   Years of education: Not on file   Highest education level: Not on file  Occupational History   Occupation: disability  Tobacco Use   Smoking status: Every Day    Packs/day: 0.50    Types: Cigarettes   Smokeless tobacco: Never  Substance and Sexual Activity   Alcohol use: Not Currently   Drug use: Yes    Types: Marijuana, Hydrocodone   Sexual activity: Not on file  Other Topics Concern   Not on file  Social History Narrative   Lives in a camper and ride a bicycle. No vehicle   Social Determinants of Health   Financial Resource Strain: Low Risk    Difficulty of Paying Living Expenses: Not hard at all  Food Insecurity: No Food Insecurity   Worried About 14/12/2020 in the Last Year: Never true   Programme researcher, broadcasting/film/video in the Last Year: Never true  Transportation Needs: Unmet Transportation Needs   Lack of Transportation (Medical): Yes   Lack of Transportation (Non-Medical): Yes  Physical Activity: Sufficiently Active   Days of Exercise per Week: 5 days   Minutes of Exercise per Session: 50 min  Stress: No Stress Concern Present   Feeling of Stress : Only a little  Social Connections: Moderately Isolated   Frequency of Communication with Friends and Family: More than three times a week   Frequency of Social Gatherings with Friends and Family:  More than three times a week   Attends Religious Services: Never   Active Member of Clubs or Organizations: No   Attends Banker Meetings: Never   Marital Status: Married    Tobacco Counseling Ready to quit: No Counseling given: Yes   Clinical Intake:  Pre-visit preparation completed: Yes  Pain : 0-10 Pain Type: Chronic pain Pain Location: Foot Pain Orientation: Right Pain Descriptors / Indicators: Aching, Discomfort, Sharp, Sore Pain Onset: More than a  month ago Pain Frequency: Intermittent     BMI - recorded: 26.61 Nutritional Status: BMI 25 -29 Overweight Nutritional Risks: None Diabetes: No     Diabetic? no  Interpreter Needed?: No  Information entered by :: Kimberely Mccannon, LPN   Activities of Daily Living In your present state of health, do you have any difficulty performing the following activities: 12/15/2020  Hearing? Y  Comment has hearing aids - recently broke them  Vision? N  Difficulty concentrating or making decisions? Y  Comment sometimes  Walking or climbing stairs? Y  Comment mild - hurts foot  Dressing or bathing? N  Doing errands, shopping? Y  Comment he only has a Tax inspector and eating ? N  Using the Toilet? N  In the past six months, have you accidently leaked urine? N  Do you have problems with loss of bowel control? N  Managing your Medications? N  Managing your Finances? N  Housekeeping or managing your Housekeeping? N  Some recent data might be hidden    Patient Care Team: Daryll Drown, NP as PCP - General (Nurse Practitioner)  Indicate any recent Medical Services you may have received from other than [facility] providers in the past year (date may be approximate).     Assessment:   This is a routine wellness examination for Timothy Blackwell.  Hearing/Vision screen Hearing Screening - Comments:: C/o moderate hearing difficulties  - he recently broke his hearing aids - will let us know if he needs help with these Vision Screening - Comments:: Wears rx glasses -   Dietary issues and exercise activities discussed: Current Exercise Habits: Home exercise routine, Type of exercise: Other - see comments (rides bicycle), Time (Minutes): 45, Frequency (Times/Week): 5, Weekly Exercise (Minutes/Week): 225, Intensity: Mild, Exercise limited by: orthopedic condition(s)   Goals Addressed   None    Depression Screen PHQ 2/9 Scores 12/15/2020 11/24/2020 04/16/2020  PHQ - 2 Score 1 1 6   PHQ- 9  Score 2 2 9     Fall Risk Fall Risk  12/15/2020 11/24/2020  Falls in the past year? 1 0  Number falls in past yr: 1 -  Injury with Fall? 1 -  Risk for fall due to : History of fall(s);Orthopedic patient -  Follow up Falls prevention discussed;Education provided -    FALL RISK PREVENTION PERTAINING TO THE HOME:  Any stairs in or around the home? No  If so, are there any without handrails? No  Home free of loose throw rugs in walkways, pet beds, electrical cords, etc? Yes  Adequate lighting in your home to reduce risk of falls? Yes   ASSISTIVE DEVICES UTILIZED TO PREVENT FALLS:  Life alert? No  Use of a cane, walker or w/c? No  Grab bars in the bathroom? No  Shower chair or bench in shower? Yes  Elevated toilet seat or a handicapped toilet? No   TIMED UP AND GO:  Was the test performed? No . Telephonic visit  Cognitive Function:  6CIT Screen 12/15/2020  What Year? 0 points  What month? 0 points  What time? 0 points  Count back from 20 0 points  Months in reverse 4 points  Repeat phrase 2 points  Total Score 6    Immunizations Immunization History  Administered Date(s) Administered   Influenza Split 12/08/2013   Influenza,inj,Quad PF,6+ Mos 11/24/2020    TDAP status: Due, Education has been provided regarding the importance of this vaccine. Advised may receive this vaccine at local pharmacy or Health Dept. Aware to provide a copy of the vaccination record if obtained from local pharmacy or Health Dept. Verbalized acceptance and understanding.  Flu Vaccine status: Up to date  Pneumococcal vaccine status: Due, Education has been provided regarding the importance of this vaccine. Advised may receive this vaccine at local pharmacy or Health Dept. Aware to provide a copy of the vaccination record if obtained from local pharmacy or Health Dept. Verbalized acceptance and understanding.  Covid-19 vaccine status: Declined, Education has been provided regarding the  importance of this vaccine but patient still declined. Advised may receive this vaccine at local pharmacy or Health Dept.or vaccine clinic. Aware to provide a copy of the vaccination record if obtained from local pharmacy or Health Dept. Verbalized acceptance and understanding.  Qualifies for Shingles Vaccine? Yes   Zostavax completed No   Shingrix Completed?: No.    Education has been provided regarding the importance of this vaccine. Patient has been advised to call insurance company to determine out of pocket expense if they have not yet received this vaccine. Advised may also receive vaccine at local pharmacy or Health Dept. Verbalized acceptance and understanding.  Screening Tests Health Maintenance  Topic Date Due   COVID-19 Vaccine (1) Never done   Pneumococcal Vaccine 86-40 Years old (1 - PCV) Never done   HIV Screening  Never done   Hepatitis C Screening  Never done   TETANUS/TDAP  Never done   COLONOSCOPY (Pts 45-71yrs Insurance coverage will need to be confirmed)  Never done   Zoster Vaccines- Shingrix (1 of 2) Never done   INFLUENZA VACCINE  Completed   HPV VACCINES  Aged Out    Health Maintenance  Health Maintenance Due  Topic Date Due   COVID-19 Vaccine (1) Never done   Pneumococcal Vaccine 82-77 Years old (1 - PCV) Never done   HIV Screening  Never done   Hepatitis C Screening  Never done   TETANUS/TDAP  Never done   COLONOSCOPY (Pts 45-67yrs Insurance coverage will need to be confirmed)  Never done   Zoster Vaccines- Shingrix (1 of 2) Never done   Colorectal cancer screening: Due - wants to wait  Lung Cancer Screening: (Low Dose CT Chest recommended if Age 78-80 years, 30 pack-year currently smoking OR have quit w/in 15years.) does not qualify.   Additional Screening:  Hepatitis C Screening: does qualifDue  Vision Screening: Recommended annual ophthalmology exams for early detection of glaucoma and other disorders of the eye. Is the patient up to date with  their annual eye exam?   Who is the provider or what is the name of the office in which the patient attends annual eye exams? [facility] If pt is not established with a provider, would they like to be referred to a provider to establish care? No nn.   Dental Screening: Recommended annual dental exams for proper oral hygiene  Community Resource Referral / Chronic Care Management: CRR required this visit?  Yes   CCM required this  visit?  No      Plan:     I have personally reviewed and noted the following in the patient's chart:   Medical and social history Use of alcohol, tobacco or illicit drugs  Current medications and supplements including opioid prescriptions. Patient is not currently taking opioid prescriptions. Functional ability and status Nutritional status Physical activity Advanced directives List of other physicians Hospitalizations, surgeries, and ER visits in previous 12 months Vitals Screenings to include cognitive, depression, and falls Referrals and appointments  In addition, I have reviewed and discussed with patient certain preventive protocols, quality metrics, and best practice recommendations. A written personalized care plan for preventive services as well as general preventive health recommendations were provided to patient.     Timothy Constable, LPN   09/81/1914   Nurse Notes: had foot and ankle surgery about 5 years ago, has intermittent problems with right foot. He stepped in a hole yesterday and now that foot is hurting much worse - made appt for Wednesday.

## 2020-12-16 ENCOUNTER — Telehealth: Payer: Self-pay | Admitting: *Deleted

## 2020-12-16 NOTE — Telephone Encounter (Signed)
° °  Telephone encounter was:  Successful.  12/16/2020 Name: COLLEEN DONAHOE MRN: [id number] DOB: 1960/06/06  EAGLE PITTA is a 61 y.o. year old male who is a primary care patient of Daryll Drown, NP . The community resource team was consulted for assistance with Transportation Needs Told patient that they have transportation benefits through [facility] and would have had [facility] provide transportation due to short notice but patient claims to have a ride for the appt tomorrow  Care guide performed the following interventions: Patient provided with information about care guide support team and interviewed to confirm resource needs Follow up call placed to community resources to determine status of patients referral.  Follow Up Plan:  No further follow up planned at this time. The patient has been provided with needed resources. Alois Cliche -[facility] Guide , Embedded Care Coordination [facility], Care Management  [phone] [address] [place] , [place] [address] Email : [email or web address]

## 2020-12-16 NOTE — Telephone Encounter (Signed)
° °  Telephone encounter was:  Unsuccessful.  12/16/2020 Name: TAELOR MONCADA MRN: [id number] DOB: 08-19-1960  Unsuccessful outbound call made today to assist with:  Transportation Needs   Outreach Attempt:  1st Attempt  A HIPAA compliant voice message was left requesting a return call.  Instructed patient to call back at   Instructed patient to call back at [phone]  at their earliest convenience. Yehuda Mao Greenauer -[facility] Guide , Embedded Care Coordination [facility], Care Management  [phone] [address] [place] , [place] [address] Email : [email or web address]

## 2020-12-17 ENCOUNTER — Encounter: Payer: Self-pay | Admitting: Nurse Practitioner

## 2020-12-17 ENCOUNTER — Ambulatory Visit (INDEPENDENT_AMBULATORY_CARE_PROVIDER_SITE_OTHER): Payer: Medicare Other

## 2020-12-17 ENCOUNTER — Ambulatory Visit (INDEPENDENT_AMBULATORY_CARE_PROVIDER_SITE_OTHER): Payer: Medicare Other | Admitting: Nurse Practitioner

## 2020-12-17 VITALS — BP 138/84 | HR 72 | Temp 98.1°F | Resp 20

## 2020-12-17 DIAGNOSIS — M79671 Pain in right foot: Secondary | ICD-10-CM | POA: Diagnosis not present

## 2020-12-17 DIAGNOSIS — M25571 Pain in right ankle and joints of right foot: Secondary | ICD-10-CM | POA: Diagnosis not present

## 2020-12-17 MED ORDER — NAPROXEN 500 MG PO TABS: 500.0000 mg | ORAL_TABLET | Freq: Two times a day (BID) | ORAL | 0 refills | Status: DC

## 2020-12-17 NOTE — Patient Instructions (Addendum)
Foot Sprain A foot sprain is an injury to one of the ligaments in the feet. Ligaments are strong tissues that connect bones to each other. The ligament can be stretched too much. In some cases, it may tear. A tear can be either partial or complete. The severity of the sprain depends on how much of the ligament was damaged or torn. What are the causes? This condition is usually caused by suddenly twisting or pivoting your foot. What increases the risk? You are more likely to develop this condition if: You play a sport, such as basketball or football. You exercise or play a sport without first warming up your muscles. You start a new workout or sport. You suddenly increase how long or hard you exercise or play a sport. You have injured your foot or ankle before. What are the signs or symptoms? Symptoms of this condition start soon after an injury and include: Pain, especially in the arch of your foot. Bruising. Swelling. Being unable to walk or use your foot to support body weight. How is this diagnosed? This condition is diagnosed with a medical history and physical exam. You may also have imaging tests, such as: X-rays to check for broken bones (fractures). An MRI to see if the ligament is torn. How is this treated? Treatment for this condition depends on the severity of the sprain. Mild sprains and major sprains can be treated with: Rest, ice, pressure (compression), and elevation (RICE). Elevation means raising your injured foot. Keeping your foot in a fixed position (immobilization) for a period of time. This is done if your ligament is overstretched or partially torn. Your health care provider will apply a bandage, splint, or walking boot to keep your foot from moving until it heals. Using crutches or a scooter for a few weeks to avoid bearing weight on your foot while it is healing. Physical therapy exercises to improve movement and strength in your foot. Major sprains may also be  treated with: Surgery. This is done if your ligament is fully torn and a procedure is needed to reconnect it to the bone. A cast or splint. This will be needed after surgery. A cast or splint will need to stay on your foot while it heals. Follow these instructions at home: If you have a bandage, splint, or boot: Wear it as told by your health care provider. Remove it only as told by your health care provider. Loosen it if your toes tingle, become numb, or turn cold and blue. Keep it clean and dry. If you have a cast: Do not put pressure on any part of the cast until it is fully hardened. This may take several hours. Do not stick anything inside the cast to scratch your skin. Doing that increases your risk for infection. Check the skin around the cast every day. Tell your health care provider about any concerns. You may put lotion on dry skin around the edges of the cast. Do not put lotion on the skin underneath the cast. Keep it clean and dry. Bathing Do not take baths, swim, or use a hot tub until your health care provider approves. Ask your health care provider if you may take showers. You may only be allowed to take sponge baths. If the bandage, splint, boot, or cast is not waterproof: Do not let it get wet. Cover it with a watertight covering when you take a bath or shower. Managing pain, stiffness, and swelling  If directed, put ice on the injured  area. To do this: If you have a removable bandage, splint, or boot, remove it as told by your health care provider. Put ice in a plastic bag. Place a towel between your skin and the bag, or between your cast and the bag. Leave the ice on for 20 minutes, 2-3 times per day. Remove the ice if your skin turns bright red. This is very important. If you cannot feel pain, heat, or cold, you have a greater risk of damage to the area. Move your toes often to reduce stiffness and swelling. Elevate the injured area above the level of your heart while  you are sitting or lying down. Activity Do not use the injured foot to support your body weight until your health care provider says that you can. Use crutches or a scooter as told by your health care provider. Ask your health care provider what activities are safe for you. Do exercises as told by your health care provider. Gradually increase how much and how far you walk until your health care provider says it is safe to return to full activity. Driving Ask your health care provider if the medicine prescribed to you requires you to avoid driving or using machinery. Ask your health care provider when it is safe to drive if you have a bandage, splint, boot, or cast on your foot. General instructions Take over-the-counter and prescription medicines only as told by your health care provider. When you can walk without pain, wear supportive shoes that have stiff soles. Do not wear flip-flops. Do not walk barefoot. Keep all follow-up visits. This is important. Contact a health care provider if: Medicine does not help your pain. Your bruising or swelling gets worse or does not get better with treatment. Your splint, boot, or cast is damaged. Get help right away if: You develop severe numbness or tingling in your foot. Your foot turns blue, white, or gray, and it feels cold. Summary A foot sprain is an injury to one of the ligaments in the feet. Ligaments are strong tissues that connect bones to each other. You may need a bandage, splint, boot, or cast to support your foot while it heals. Sometimes, surgery may be needed. You may need physical therapy exercises to improve movement and strength in your foot. This information is not intended to replace advice given to you by your health care provider. Make sure you discuss any questions you have with your health care provider. Document Revised: 04/13/2019 Document Reviewed: 04/13/2019 Elsevier Patient Education  2022 Elsevier Inc. Foot Pain Many  things can cause foot pain. Some common causes are: An injury. A sprain. Arthritis. Blisters. Bunions. Follow these instructions at home: Managing pain, stiffness, and swelling If directed, put ice on the painful area: Put ice in a plastic bag. Place a towel between your skin and the bag. Leave the ice on for 20 minutes, 2-3 times a day.  Activity Do not stand or walk for long periods. Return to your normal activities as told by your health care provider. Ask your health care provider what activities are safe for you. Do stretches to relieve foot pain and stiffness as told by your health care provider. Do not lift anything that is heavier than 10 lb (4.5 kg), or the limit that you are told, until your health care provider says that it is safe. Lifting a lot of weight can put added pressure on your feet. Lifestyle Wear comfortable, supportive shoes that fit you well. Do not wear high  heels. Keep your feet clean and dry. General instructions Take over-the-counter and prescription medicines only as told by your health care provider. Rub your foot gently. Pay attention to any changes in your symptoms. Keep all follow-up visits as told by your health care provider. This is important. Contact a health care provider if: Your pain does not get better after a few days of self-care. Your pain gets worse. You cannot stand on your foot. Get help right away if: Your foot is numb or tingling. Your foot or toes are swollen. Your foot or toes turn white or blue. You have warmth and redness along your foot. Summary Common causes of foot pain are injury, sprain, arthritis, blisters, or bunions. Ice, medicines, and comfortable shoes may help foot pain. Contact your health care provider if your pain does not get better after a few days of self-care. This information is not intended to replace advice given to you by your health care provider. Make sure you discuss any questions you have with your  health care provider. Document Revised: 03/26/2020 Document Reviewed: 03/26/2020 Elsevier Patient Education  2022 [employer].

## 2020-12-17 NOTE — Assessment & Plan Note (Addendum)
Right ankle/right leg pain.  This is not new for patient as he has had multiple foot surgeries in the past.  Patient fell few days ago and has had painful, throbbing pain to the right ankle and foot.  Patient also reports calf pain.  On assessment patient's feet is extremely cold, no numbness, warmth assessed.  Pulses within normal limits.  Patient's foot presents as PVD.  Long history of tobacco use.  Referral to ultrasound right foot leg completed, referral to orthopedic completed.  Naproxen 500 mg tablet by mouth for pain.  Immobilize ankle, ice or warm compress as tolerated.  Rx sent to pharmacy.  Follow-up with worsening unresolved symptoms.   X-ray results  No acute fracture or dislocation, no evidence of hardware failure or loosening. Joint spaces are preserved soft tissues are unremarkable. Bone mineralization normal. I will not complete an orthopedic referral at this time. Advised patient to continue pain management and follow-up with unresolved symptoms.

## 2020-12-17 NOTE — Progress Notes (Signed)
Acute Office Visit  Subjective:    Patient ID: Timothy Blackwell, male    DOB: [birth year], 60 y.o.   MRN: [id number]  Chief Complaint  Patient presents with   Foot Pain         Foot Injury  The incident occurred 5 to 7 days ago. The incident occurred at home. The injury mechanism was a fall and a twisting injury. The pain is present in the right leg. The quality of the pain is described as aching. The pain is at a severity of 6/10. The pain is moderate. The pain has been Constant since onset. Pertinent negatives include no muscle weakness, numbness or tingling. He reports no foreign bodies present. He has tried nothing for the symptoms.    Past Medical History:  Diagnosis Date   Depression     History reviewed. No pertinent surgical history.  History reviewed. No pertinent family history.  Social History   Socioeconomic History   Marital status: Married    Spouse name: Not on file   Number of children: Not on file   Years of education: Not on file   Highest education level: Not on file  Occupational History   Occupation: disability  Tobacco Use   Smoking status: Every Day    Packs/day: 0.50    Types: Cigarettes   Smokeless tobacco: Never  Substance and Sexual Activity   Alcohol use: Not Currently   Drug use: Yes    Types: Marijuana, Hydrocodone   Sexual activity: Not on file  Other Topics Concern   Not on file  Social History Narrative   Lives in a camper and ride a bicycle. No vehicle   Social Determinants of Health   Financial Resource Strain: Low Risk    Difficulty of Paying Living Expenses: Not hard at all  Food Insecurity: No Food Insecurity   Worried About Programme researcher, broadcasting/film/video in the Last Year: Never true   Barista in the Last Year: Never true  Transportation Needs: Unmet Transportation Needs   Lack of Transportation (Medical): Yes   Lack of Transportation (Non-Medical): Yes  Physical Activity: Sufficiently Active   Days of Exercise per Week:  5 days   Minutes of Exercise per Session: 50 min  Stress: No Stress Concern Present   Feeling of Stress : Only a little  Social Connections: Moderately Isolated   Frequency of Communication with Friends and Family: More than three times a week   Frequency of Social Gatherings with Friends and Family: More than three times a week   Attends Religious Services: Never   Database administrator or Organizations: No   Attends Engineer, structural: Never   Marital Status: Married  Catering manager Violence: Not At Risk   Fear of Current or Ex-Partner: No   Emotionally Abused: No   Physically Abused: No   Sexually Abused: No    Outpatient Medications Prior to Visit  Medication Sig Dispense Refill   venlafaxine (EFFEXOR) 37.5 MG tablet Take 1 tablet (37.5 mg total) by mouth daily. 30 tablet 5   No facility-administered medications prior to visit.    Not on File  Review of Systems  Constitutional: Negative.   HENT: Negative.    Eyes: Negative.   Respiratory: Negative.    Cardiovascular: Negative.   Gastrointestinal: Negative.   Genitourinary: Negative.   Neurological:  Negative for tingling and numbness.  All other systems reviewed and are negative.     Objective:  Physical Exam Vitals and nursing note reviewed.  Constitutional:      Appearance: Normal appearance.  HENT:     Head: Normocephalic.     Mouth/Throat:     Mouth: Mucous membranes are moist.     Pharynx: Oropharynx is clear.  Eyes:     Conjunctiva/sclera: Conjunctivae normal.  Cardiovascular:     Rate and Rhythm: Normal rate and regular rhythm.     Pulses: Normal pulses.     Heart sounds: Normal heart sounds.  Pulmonary:     Effort: Pulmonary effort is normal.     Breath sounds: Normal breath sounds.  Abdominal:     General: Bowel sounds are normal.  Musculoskeletal:     Right lower leg: Tenderness present. No swelling or deformity. No edema.     Right foot: Decreased range of motion.  Tenderness present. No swelling.  Skin:    General: Skin is warm.     Findings: No rash.  Neurological:     General: No focal deficit present.     Mental Status: He is alert and oriented to person, place, and time.  Psychiatric:        Behavior: Behavior normal.    BP 138/84    Pulse 72    Temp 98.1 F (36.7 C) (Temporal)    Resp 20    SpO2 98%  Wt Readings from Last 3 Encounters:  12/15/20 175 lb (79.4 kg)  11/24/20 174 lb (78.9 kg)  04/16/20 157 lb (71.2 kg)    Health Maintenance Due  Topic Date Due   COVID-19 Vaccine (1) Never done   Pneumococcal Vaccine 52-41 Years old (1 - PCV) Never done   HIV Screening  Never done   Hepatitis C Screening  Never done   TETANUS/TDAP  Never done   COLONOSCOPY (Pts 45-13yrs Insurance coverage will need to be confirmed)  Never done   Zoster Vaccines- Shingrix (1 of 2) Never done    There are no preventive care reminders to display for this patient.       Assessment & Plan:   Problem List Items Addressed This Visit       Other   Right ankle pain    Right ankle/right leg pain.  This is not new for patient as he has had multiple foot surgeries in the past.  Patient fell few days ago and has had painful, throbbing pain to the right ankle and foot.  Patient also reports calf pain.  On assessment patient's feet is extremely cold, no numbness, warmth assessed.  Pulses within normal limits.  Patient's foot presents as PVD.  Long history of tobacco use.  Referral to ultrasound right foot leg completed, referral to orthopedic completed.  Naproxen 500 mg tablet by mouth for pain.  Immobilize ankle, ice or warm compress as tolerated.  Rx sent to pharmacy.  Follow-up with worsening unresolved symptoms.   X-ray results  No acute fracture or dislocation, no evidence of hardware failure or loosening.  Joint spaces are preserved soft tissues are unremarkable.  Bone mineralization normal.  I will not complete an orthopedic referral at this time.   Advised patient to continue pain management and follow-up with unresolved symptoms.      Relevant Orders   US Venous Img Lower Unilateral Right   Other Visit Diagnoses     Foot pain, right    -  Primary   Relevant Orders   DG Foot Complete Right (Completed)        Meds  ordered this encounter  Medications   naproxen (NAPROSYN) 500 MG tablet    Sig: Take 1 tablet (500 mg total) by mouth 2 (two) times daily with a meal.    Dispense:  30 tablet    Refill:  0    Order Specific Question:   Supervising Provider    AnswerStandley Brooking     Daryll Drown, NP

## 2020-12-19 NOTE — Addendum Note (Signed)
Addended by: Quay Burow on: 12/19/2020 09:36 AM   Modules accepted: Orders

## 2020-12-23 ENCOUNTER — Ambulatory Visit (HOSPITAL_COMMUNITY): Admission: RE | Admit: 2020-12-23 | Payer: Medicare Other | Source: Ambulatory Visit

## 2021-05-25 ENCOUNTER — Ambulatory Visit: Payer: Medicare Other | Admitting: Nurse Practitioner

## 2021-06-25 ENCOUNTER — Telehealth: Payer: Self-pay | Admitting: Emergency Medicine

## 2021-06-25 NOTE — Telephone Encounter (Signed)
Telephone call from Patient, Timothy Blackwell states that he is very depressed has been crying and does not have a clear plan but is depressed and has a history of suicidal. Patient denies having any weapons in his home. He states that he has been taking his prescribed medications however we do not have record of [name] filling it. Patient's speech was slurred and he seemed to be on edge and he voiced that he was fearful for his life. Patient also reported that he lives in a camper in the middle of a field. He has electricity but does not have running water. Patient states that he has not showered in 2 months.  After consulting with Harlow Mares, NP our recommendations for the patient were to seek medical care and patient was in agreeable. I supplied patient with the 24 hour behavioral health urgent care number [phone]. Patient received and repeated number back to me. I advised patient that upon hanging up the phone with him that I would call EMS/Sheriff to come assist him to a safe place and get evaluation at the hospital.   EMS was contacted and given information regarding patient. EMS in [place] were dispatched to the patient's address. Deputy from [facility] did contact me after receiving the call for more information, I provided that to them. Advised to call back with any concerns/questions.   Kathi Simpers, RN

## 2021-06-26 ENCOUNTER — Encounter: Payer: Self-pay | Admitting: Nurse Practitioner

## 2021-06-26 ENCOUNTER — Ambulatory Visit (INDEPENDENT_AMBULATORY_CARE_PROVIDER_SITE_OTHER): Payer: Medicare Other | Admitting: Nurse Practitioner

## 2021-06-26 DIAGNOSIS — F419 Anxiety disorder, unspecified: Secondary | ICD-10-CM | POA: Diagnosis not present

## 2021-06-26 DIAGNOSIS — F339 Major depressive disorder, recurrent, unspecified: Secondary | ICD-10-CM

## 2021-06-26 MED ORDER — VENLAFAXINE HCL ER 75 MG PO CP24: 75.0000 mg | ORAL_CAPSULE | Freq: Every day | ORAL | 3 refills | Status: DC

## 2021-06-26 NOTE — Telephone Encounter (Signed)
Patient calling wanting to talk to [facility]. Never heard back from anyone 6-22.

## 2022-02-08 ENCOUNTER — Telehealth: Payer: Self-pay | Admitting: Nurse Practitioner

## 2022-02-08 NOTE — Telephone Encounter (Signed)
No answer unable to leave a message for patient to call back and schedule Medicare Annual Wellness Visit (AWV) to be completed by video or phone.   Last AWV: 12/15/2020   Please schedule at anytime with [place]     Any questions, please contact me at [phone]   Thank you,   [facility] Ambulatory Clinical Support for [place] [facility] ??[phone] or ??[phone]

## 2022-03-17 IMAGING — DX DG FOOT COMPLETE 3+V*R*
3 series · 3 of 3 positions shown · non-contrast
Comparison: Right foot x-ray report dated September 20, 2019.

CLINICAL DATA: Chronic right foot pain.

EXAM:
RIGHT FOOT COMPLETE - 3+ VIEW

[foot ap]
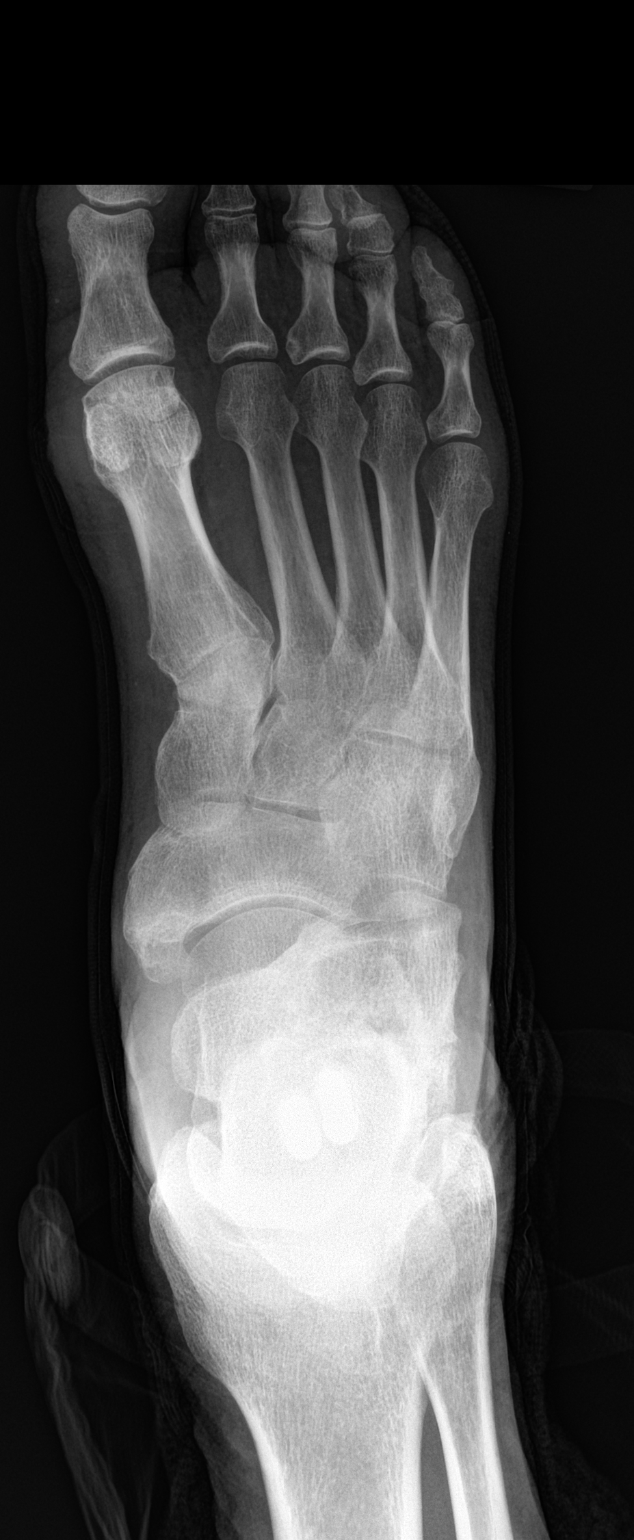

[foot obl]
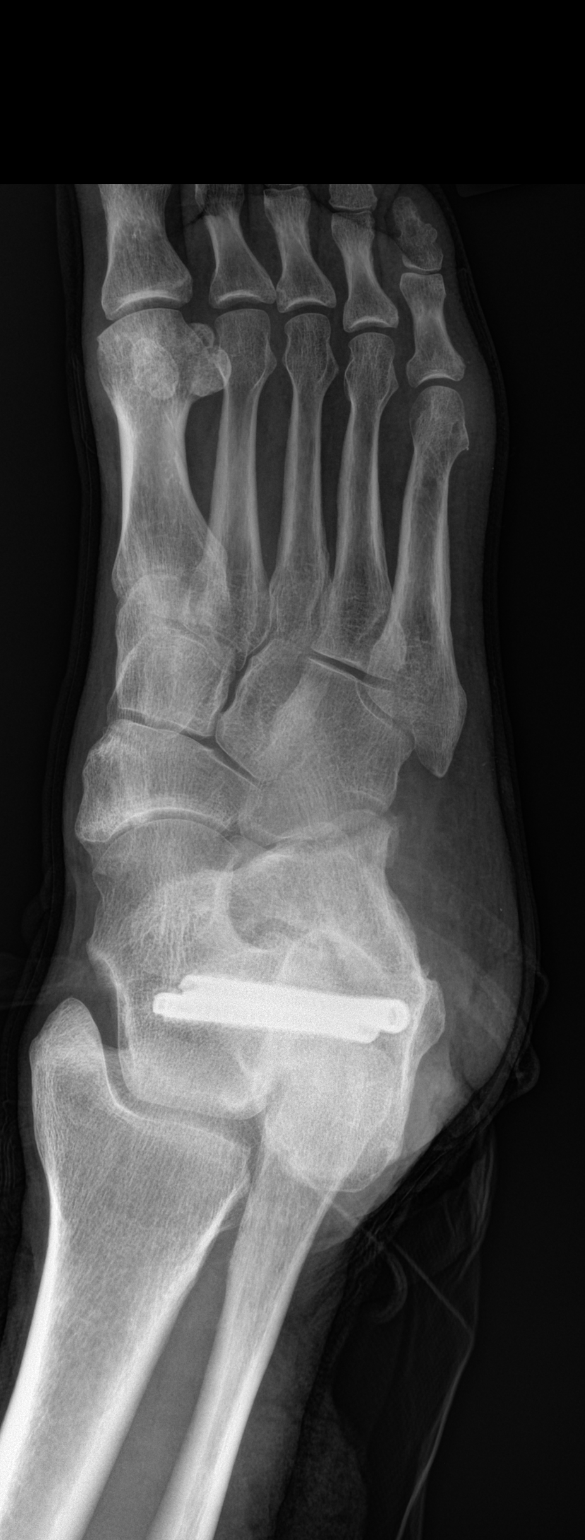

[foot lat]
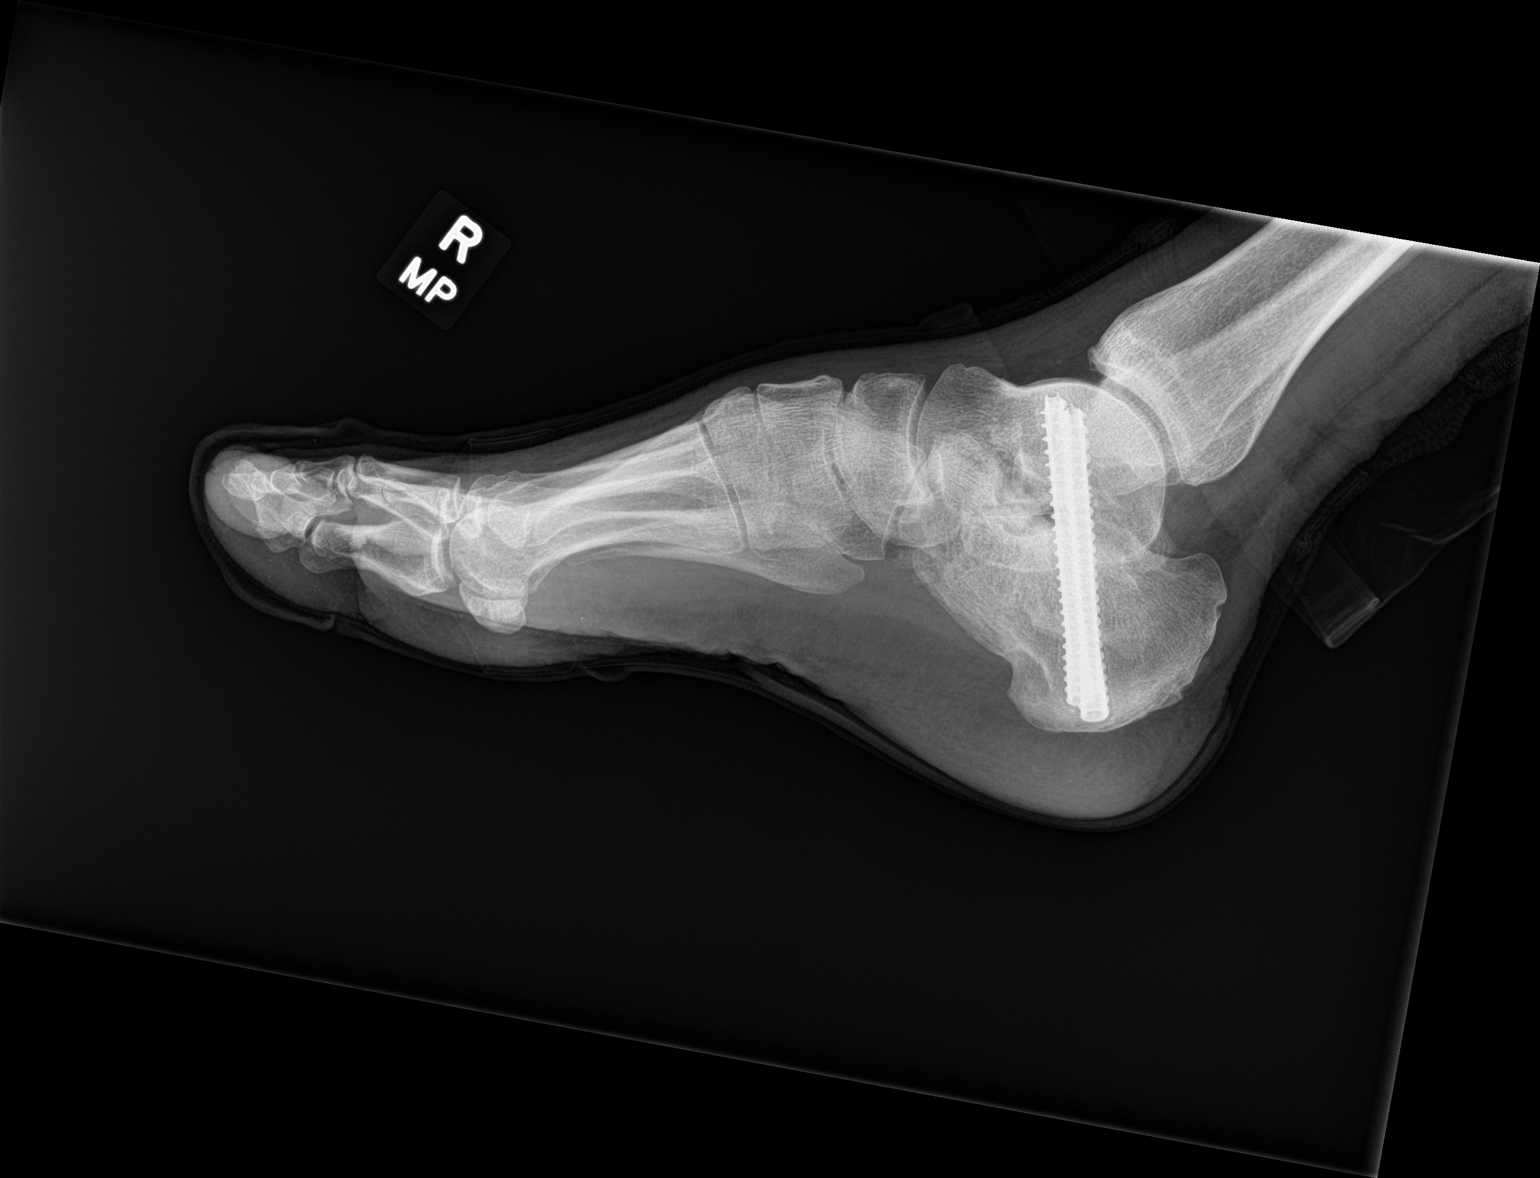

[3 of 3 positions shown; findings below may reference images not displayed]

FINDINGS: No acute fracture or dislocation. Prior subtalar arthrodesis. No
evidence of hardware failure or loosening. Remaining joint spaces
are preserved. Bone mineralization is normal. Soft tissues are
unremarkable.
IMPRESSION: 1. Prior subtalar arthrodesis without hardware complication. No
acute osseous abnormality.

## 2022-04-26 ENCOUNTER — Telehealth: Payer: Self-pay | Admitting: Nurse Practitioner

## 2022-04-26 NOTE — Telephone Encounter (Signed)
Called patient to schedule Medicare Annual Wellness Visit (AWV). No voicemail available to leave a message.  Mailbox is full  Last date of AWV: 12/15/2020   Please schedule an appointment at any time with either [name] or [place]. .  If any questions, please contact me at [phone].  Thank you,  Judeth Cornfield,  AMB Clinical Support [facility] AWV Program Direct Dial ??[phone]

## 2022-05-25 ENCOUNTER — Telehealth: Payer: Self-pay | Admitting: Nurse Practitioner

## 2022-05-25 NOTE — Telephone Encounter (Signed)
Tried to call pt but no answer and VM not set up.

## 2022-05-26 NOTE — Telephone Encounter (Signed)
TTC pt but no answer and VM not set up. 

## 2022-05-28 NOTE — Telephone Encounter (Signed)
No answer, no voicemail.

## 2022-06-03 NOTE — Telephone Encounter (Signed)
Never able to connect with patient, encounter closed.

## 2022-08-09 ENCOUNTER — Encounter: Payer: Self-pay | Admitting: Family Medicine

## 2022-08-09 ENCOUNTER — Ambulatory Visit (INDEPENDENT_AMBULATORY_CARE_PROVIDER_SITE_OTHER): Payer: Medicare HMO | Admitting: Family Medicine

## 2022-08-09 VITALS — BP 123/74 | HR 76 | Temp 98.7°F | Ht 68.0 in | Wt 182.0 lb

## 2022-08-09 DIAGNOSIS — S838X2A Sprain of other specified parts of left knee, initial encounter: Secondary | ICD-10-CM | POA: Diagnosis not present

## 2022-08-09 DIAGNOSIS — M25562 Pain in left knee: Secondary | ICD-10-CM

## 2022-08-09 MED ORDER — KETOROLAC TROMETHAMINE 30 MG/ML IJ SOLN
30.0000 mg | Freq: Once | INTRAMUSCULAR | Status: AC
  Administered 2022-08-09: 30 mg via INTRAMUSCULAR

## 2022-08-09 MED ORDER — DICLOFENAC SODIUM 75 MG PO TBEC: 75.0000 mg | DELAYED_RELEASE_TABLET | Freq: Two times a day (BID) | ORAL | 0 refills | Status: DC | PRN

## 2022-08-09 NOTE — Progress Notes (Signed)
Subjective: CC: Left knee pain PCP: No primary care provider on file. [id number] R Bangerter is a 62 y.o. male presenting to clinic today for:  1.  Left knee pain Patient reports about a 2 to 3-week history of left-sided knee pain.  He notes that it hurts worse when lifted.  He denies any preceding injury but notes that the knee pain started with a severe spasm that encircled the entire knee joint.  He is to be of a ride his bike but he can no longer do this secondary to pain.  He has not really utilize much orally for the pain but has been wrapping it in an Ace bandage in efforts to alleviate discomfort.  No sensory changes reported.   ROS: Per HPI  No Known Allergies Past Medical History:  Diagnosis Date   Depression    No current outpatient medications on file. Social History   Socioeconomic History   Marital status: Married    Spouse name: Not on file   Number of children: Not on file   Years of education: Not on file   Highest education level: Not on file  Occupational History   Occupation: disability  Tobacco Use   Smoking status: Every Day    Current packs/day: 1.00    Types: Cigarettes   Smokeless tobacco: Never  Substance and Sexual Activity   Alcohol use: Not Currently   Drug use: Yes    Types: Marijuana, Hydrocodone   Sexual activity: Not Currently  Other Topics Concern   Not on file  Social History Narrative   Lives in a camper and ride a bicycle. No vehicle   Social Determinants of Health   Financial Resource Strain: Low Risk  (12/15/2020)   Overall Financial Resource Strain (CARDIA)    Difficulty of Paying Living Expenses: Not hard at all  Food Insecurity: No Food Insecurity (12/15/2020)   Hunger Vital Sign    Worried About Running Out of Food in the Last Year: Never true    Ran Out of Food in the Last Year: Never true  Transportation Needs: Unmet Transportation Needs (12/15/2020)   PRAPARE - Transportation    Lack of Transportation (Medical): Yes     Lack of Transportation (Non-Medical): Yes  Physical Activity: Sufficiently Active (12/15/2020)   Exercise Vital Sign    Days of Exercise per Week: 5 days    Minutes of Exercise per Session: 50 min  Stress: No Stress Concern Present (12/15/2020)   [name] of Occupational Health - Occupational Stress Questionnaire    Feeling of Stress : Only a little  Social Connections: Moderately Isolated (12/15/2020)   Social Connection and Isolation Panel [NHANES]    Frequency of Communication with Friends and Family: More than three times a week    Frequency of Social Gatherings with Friends and Family: More than three times a week    Attends Religious Services: Never    Database administrator or Organizations: No    Attends Banker Meetings: Never    Marital Status: Married  Catering manager Violence: Not At Risk (12/15/2020)   Humiliation, Afraid, Rape, and Kick questionnaire    Fear of Current or Ex-Partner: No    Emotionally Abused: No    Physically Abused: No    Sexually Abused: No   History reviewed. No pertinent family history.  Objective: Office vital signs reviewed. BP 123/74   Pulse 76   Temp 98.7 F (37.1 C)   Ht 5\' 8"  (1.727 m)  Wt 182 lb (82.6 kg)   SpO2 94%   BMI 27.67 kg/m   Physical Examination:  General: Awake, alert, nontoxic, No acute distress MSK: Very antalgic gait  Left knee: Mild joint effusion noted.  Tenderness palpation is not exquisite on exam to the joint line but he does have a very positive Thessaly maneuver.  Limited active range of motion in flexion due to pain.  No tenderness palpation to the posterior popliteal fossa and no palpable popliteal masses  Assessment/ Plan: 62 y.o. male   Acute pain of left knee - Plan: Ambulatory referral to Physical Therapy, diclofenac (VOLTAREN) 75 MG EC tablet, ketorolac (TORADOL) 30 MG/ML injection 30 mg  Meniscal injury, left, initial encounter - Plan: Ambulatory referral to Physical  Therapy, diclofenac (VOLTAREN) 75 MG EC tablet, ketorolac (TORADOL) 30 MG/ML injection 30 mg  Toradol injection IM today.  Oral NSAID to pharmacy.  Referral to PT.  Follow up with new PCP in 4 weeks.  May need referral to ortho +/- MRI of knee if no improvement.  In meantime, brace, elevate, ice.  Avoid OTC NSAIDs while taking Diclofenac   No orders of the defined types were placed in this encounter.  No orders of the defined types were placed in this encounter.    Raliegh Ip, DO [facility] [phone]

## 2022-08-09 NOTE — Patient Instructions (Signed)
You have prescribed a nonsteroidal anti-inflammatory drug (NSAID) today. This will help with your pain and inflammation. Please do not take any other NSAIDs (ibuprofen/Motrin/Advil, naproxen/Aleve, meloxicam/Mobic, Voltaren/diclofenac). Please make sure to eat a meal when taking this medication.   Caution: If you have a history of acid reflux/indigestion, I recommend that you take an antacid (such as Prilosec, Prevacid) daily while on the NSAID. If you have a history of bleeding disorder, gastric ulcer, are on a blood thinner (like warfarin/Coumadin, Xarelto, Eliquis, etc) please do not take NSAID. If you have ever had a heart attack, you should not take NSAIDs.   Meniscus Tear  A meniscus tear is a knee injury that happens when a piece of the meniscus is torn. The meniscus is a thick, rubbery, wedge-shaped piece of cartilage in the knee. Each knee has two menisci sitting between the upper bone (femur) and lower bone (tibia) that form the knee joint. Each meniscus acts as a shock absorber for the knee. A torn meniscus is a common knee injury, ranging from mild to severe. Surgery may be needed to repair a severe tear. What are the causes? This condition may be caused by kneeling, squatting, twisting, or pivoting movements. Sports-related injuries are the most common cause, often resulting from: Running and stopping suddenly. Changing direction. Being tackled or knocked off your feet. Lifting or carrying heavy weights. As people get older, their menisci get thinner and weaker. Tears can happen more easily in older people, for example, when climbing stairs. What increases the risk? You are more likely to develop this condition if you: Play contact sports. Have a job that requires kneeling or squatting. Are male. Are over 45 years old. What are the signs or symptoms? Symptoms of this condition include: Knee pain, especially at the side of the knee joint. You may feel pain immediately after  injury, or hear a pop and feel pain later. A feeling that your knee is clicking, catching, locking, or giving way (weakness, instability). Not being able to fully bend or extend your knee. Bruising or swelling in your knee. How is this diagnosed? This condition may be diagnosed based on your symptoms and a physical exam. You may also have tests, such as: X-rays. MRI. Arthroscopy. This is a procedure to look inside your knee with a narrow surgical telescope. You may be referred to a knee specialist (orthopedic surgeon). How is this treated? Treatment for this injury depends on the severity of the tear. Treatment for a mild tear may include: Rest. Medicine to reduce pain and swelling, usually a nonsteroidal anti-inflammatory drug (NSAID), like ibuprofen. A knee brace, sleeve, or wrap. Using crutches or a walker to keep weight off your knee and help with walking. Exercises to strengthen your knee (physical therapy). You may need surgery if you have a severe tear or if other treatments fail. Follow these instructions at home: If you have a brace, sleeve, or wrap: Wear it, as told by your health care provider. Remove it, only as told by your health care provider. Loosen the brace, sleeve, or wrap if your toes tingle, become numb, or turn cold and blue. Keep the brace, sleeve, or wrap clean. If the brace, sleeve, or wrap is not waterproof: Do not let it get wet. Cover it with a watertight covering when you take a bath or shower. Managing pain, stiffness, and swelling  Take over-the-counter and prescription medicines only as told by your health care provider. If directed, put ice on your knee. To do  this: If you have a removable brace, sleeve, or wrap, remove it as told by your health care provider. Put ice in a plastic bag. Place a towel between your skin and the bag. Leave the ice on for 20 minutes, 2-3 times per day. Remove the ice if your skin turns bright red. This is very important.  If you cannot feel pain, heat, or cold, you have a greater risk of damage to the area. Move your toes often to reduce stiffness and swelling. Raise (elevate) the injured area above the level of your heart while you are sitting or lying down. Activity Do not use the injured limb to support your body weight until your health care provider says that you can. Use crutches or a walker as told by your health care provider. Return to your normal activities as told by your health care provider. Ask your health care provider what activities are safe for you. Perform range-of-motion exercises only as told by your health care provider. Begin doing exercises to strengthen your knee and leg muscles only as told by your health care provider. After you recover, your health care provider may recommend these exercises to help prevent another injury. General instructions Use a knee brace, sleeve, or wrap as told by your health care provider. Ask your health care provider when it is safe to drive if you have a brace, sleeve, or wrap on your knee. Do not use any products that contain nicotine or tobacco, such as cigarettes, e-cigarettes, and chewing tobacco. If you need help quitting, ask your health care provider. Ask your health care provider if the medicine prescribed to you: Requires you to avoid driving or using heavy machinery. Can cause constipation. You may need to take these actions to prevent or treat constipation: Drink enough fluid to keep your urine pale yellow. Take over-the-counter or prescription medicines. Eat foods that are high in fiber, such as beans, whole grains, and fresh fruits and vegetables. Limit foods that are high in fat and processed sugars, such as fried or sweet foods. Keep all follow-up visits. This is important. Contact a health care provider if: You have a fever. Your knee becomes red, tender, or swollen. Your pain medicine is not controlling your pain. Your symptoms get worse  or do not improve after 2 weeks of home care. Summary A meniscus tear is a knee injury that happens when a piece of the meniscus is torn. Treatment for this injury depends on the severity of the tear. You may need surgery if you have a severe tear or if other treatments fail. Rest, ice, and raise (elevate) your injured knee, as told by your health care provider. This will help lessen pain and swelling. Contact a health care provider if you have new symptoms, your symptoms worsen, or they do not improve after 2 weeks of home care. Keep all follow-up visits. This is important. This information is not intended to replace advice given to you by your health care provider. Make sure you discuss any questions you have with your health care provider. Document Revised: 05/03/2019 Document Reviewed: 05/03/2019 Elsevier Patient Education  2024 [employer].

## 2022-08-18 DIAGNOSIS — I10 Essential (primary) hypertension: Secondary | ICD-10-CM | POA: Diagnosis not present

## 2022-08-18 DIAGNOSIS — Z720 Tobacco use: Secondary | ICD-10-CM | POA: Diagnosis not present

## 2022-08-18 DIAGNOSIS — Z008 Encounter for other general examination: Secondary | ICD-10-CM | POA: Diagnosis not present

## 2022-08-18 DIAGNOSIS — M1990 Unspecified osteoarthritis, unspecified site: Secondary | ICD-10-CM | POA: Diagnosis not present

## 2022-08-18 DIAGNOSIS — Z791 Long term (current) use of non-steroidal anti-inflammatories (NSAID): Secondary | ICD-10-CM | POA: Diagnosis not present

## 2022-09-04 NOTE — Progress Notes (Deleted)
Established Patient Office Visit  Subjective   Patient ID: Timothy Blackwell, male    DOB: 1960-04-06  Age: 62 y.o. MRN: [id number]  No chief complaint on file.   HPI  Patient Active Problem List   Diagnosis Date Noted   Anxiety 11/24/2020   Depression, recurrent (HCC) 04/16/2020   Kidney stones 01/14/2014   Right ankle pain 11/19/2013   Past Medical History:  Diagnosis Date   Depression    No past surgical history on file. Social History   Tobacco Use   Smoking status: Every Day    Current packs/day: 1.00    Types: Cigarettes   Smokeless tobacco: Never  Substance Use Topics   Alcohol use: Not Currently   Drug use: Yes    Types: Marijuana, Hydrocodone   Social History   Socioeconomic History   Marital status: Married    Spouse name: Not on file   Number of children: Not on file   Years of education: Not on file   Highest education level: Not on file  Occupational History   Occupation: disability  Tobacco Use   Smoking status: Every Day    Current packs/day: 1.00    Types: Cigarettes   Smokeless tobacco: Never  Substance and Sexual Activity   Alcohol use: Not Currently   Drug use: Yes    Types: Marijuana, Hydrocodone   Sexual activity: Not Currently  Other Topics Concern   Not on file  Social History Narrative   Lives in a camper and ride a bicycle. No vehicle   Social Determinants of Health   Financial Resource Strain: Low Risk  (12/15/2020)   Overall Financial Resource Strain (CARDIA)    Difficulty of Paying Living Expenses: Not hard at all  Food Insecurity: No Food Insecurity (12/15/2020)   Hunger Vital Sign    Worried About Running Out of Food in the Last Year: Never true    Ran Out of Food in the Last Year: Never true  Transportation Needs: Unmet Transportation Needs (12/15/2020)   PRAPARE - Transportation    Lack of Transportation (Medical): Yes    Lack of Transportation (Non-Medical): Yes  Physical Activity: Sufficiently Active  (12/15/2020)   Exercise Vital Sign    Days of Exercise per Week: 5 days    Minutes of Exercise per Session: 50 min  Stress: No Stress Concern Present (12/15/2020)   [name] of Occupational Health - Occupational Stress Questionnaire    Feeling of Stress : Only a little  Social Connections: Moderately Isolated (12/15/2020)   Social Connection and Isolation Panel [NHANES]    Frequency of Communication with Friends and Family: More than three times a week    Frequency of Social Gatherings with Friends and Family: More than three times a week    Attends Religious Services: Never    Database administrator or Organizations: No    Attends Banker Meetings: Never    Marital Status: Married  Catering manager Violence: Not At Risk (12/15/2020)   Humiliation, Afraid, Rape, and Kick questionnaire    Fear of Current or Ex-Partner: No    Emotionally Abused: No    Physically Abused: No    Sexually Abused: No   Family Status  Relation Name Status   Mother  Deceased   Father  Deceased  No partnership data on file   No family history on file. No Known Allergies    ROS Negative unless indicated in HPI   Objective:     There  were no vitals taken for this visit. BP Readings from Last 3 Encounters:  08/09/22 123/74  12/17/20 138/84  11/24/20 129/70   Wt Readings from Last 3 Encounters:  08/09/22 182 lb (82.6 kg)  12/15/20 175 lb (79.4 kg)  11/24/20 174 lb (78.9 kg)      Physical Exam   No results found for any visits on 09/09/22.       Assessment & Plan:  There are no diagnoses linked to this encounter.  Continue healthy lifestyle choices, including diet (rich in fruits, vegetables, and lean proteins, and low in salt and simple carbohydrates) and exercise (at least 30 minutes of moderate physical activity daily).     The above assessment and management plan was discussed with the patient. The patient verbalized understanding of and has agreed to the  management plan. Patient is aware to call the clinic if they develop any new symptoms or if symptoms persist or worsen. Patient is aware when to return to the clinic for a follow-up visit. Patient educated on when it is appropriate to go to the emergency department.  No follow-ups on file.   Timothy Aran Santa Lighter, DNP [facility] [address] [place], [place] [address] [phone]

## 2022-09-05 ENCOUNTER — Other Ambulatory Visit: Payer: Self-pay | Admitting: Family Medicine

## 2022-09-05 DIAGNOSIS — S838X2A Sprain of other specified parts of left knee, initial encounter: Secondary | ICD-10-CM

## 2022-09-05 DIAGNOSIS — M25562 Pain in left knee: Secondary | ICD-10-CM

## 2022-09-09 ENCOUNTER — Ambulatory Visit: Payer: Medicare HMO | Admitting: Nurse Practitioner

## 2022-11-01 DIAGNOSIS — M25522 Pain in left elbow: Secondary | ICD-10-CM | POA: Insufficient documentation

## 2022-11-09 DIAGNOSIS — M25522 Pain in left elbow: Secondary | ICD-10-CM | POA: Diagnosis not present

## 2022-11-22 DIAGNOSIS — M25522 Pain in left elbow: Secondary | ICD-10-CM | POA: Diagnosis not present

## 2022-11-22 DIAGNOSIS — M25532 Pain in left wrist: Secondary | ICD-10-CM | POA: Insufficient documentation

## 2022-11-24 ENCOUNTER — Ambulatory Visit (INDEPENDENT_AMBULATORY_CARE_PROVIDER_SITE_OTHER): Payer: Medicare HMO

## 2022-11-24 VITALS — Ht 68.0 in | Wt 180.0 lb

## 2022-11-24 DIAGNOSIS — H9193 Unspecified hearing loss, bilateral: Secondary | ICD-10-CM

## 2022-11-24 DIAGNOSIS — Z0001 Encounter for general adult medical examination with abnormal findings: Secondary | ICD-10-CM

## 2022-11-24 DIAGNOSIS — Z114 Encounter for screening for human immunodeficiency virus [HIV]: Secondary | ICD-10-CM

## 2022-11-24 DIAGNOSIS — Z1159 Encounter for screening for other viral diseases: Secondary | ICD-10-CM

## 2022-11-24 DIAGNOSIS — Z1211 Encounter for screening for malignant neoplasm of colon: Secondary | ICD-10-CM

## 2022-11-24 DIAGNOSIS — Z5982 Transportation insecurity: Secondary | ICD-10-CM | POA: Diagnosis not present

## 2022-11-24 DIAGNOSIS — Z0000 Encounter for general adult medical examination without abnormal findings: Secondary | ICD-10-CM

## 2022-11-24 NOTE — Patient Instructions (Addendum)
Mr. Timothy Blackwell , Thank you for taking time to come for your Medicare Wellness Visit. I appreciate your ongoing commitment to your health goals. Please review the following plan we discussed and let me know if I can assist you in the future.   Referrals/Orders/Follow-Ups/Clinician Recommendations:  Next Medicare Annual Wellness Visit: November 25, 2023 at 1:10pm virtual visit  You have been referred to an audiologist to have a hearing test.If you haven't heard from them in the next week, please call their office to schedule your appointment.   [facility] Address: [address], [place], [place] [address] Phone: [phone]  A referral was placed for you today for community resources. This will check to see what community resources are available for you for anything we discussed during your wellness visit today that you may be having difficulty with such a obtaining food, paying for utilities, or transportation needs. Someone will contact you in a few days. If you haven't heard from someone within the next month, please call the number below  [facility] Concierge Line  [phone]   A Hepatitis C and HIV Screening has been ordered for you today. Have these drawn at [facility] at your convenience.You do not have to fast for these labs.  The office will call you to schedule a follow up with Timothy Blackwell. Please keep that appointment.   An order has been placed for a Cologuard for you. They will mail you the kit with instructions on how to obtain the sample and send it back in to be tested. If you do not received your kit, please call our office and let us know.     This is a list of the screening recommended for you and due dates:  Health Maintenance  Topic Date Due   HIV Screening  Never done   Hepatitis C Screening  Never done   Colon Cancer Screening  Never done   Zoster (Shingles) Vaccine (1 of 2) Never done   Flu Shot  08/05/2022   COVID-19 Vaccine (1 - 2023-24 season) Never done   Medicare Annual  Wellness Visit  11/24/2023   DTaP/Tdap/Td vaccine (2 - Td or Tdap) 10/31/2032   HPV Vaccine  Aged Out    Advanced directives: (Declined) Advance directive discussed with you today. Even though you declined this today, please call our office should you change your mind, and we can give you the proper paperwork for you to fill out.  Next Medicare Annual Wellness Visit scheduled for next year: Yes  Preventive Care 60-24 Years Old, Male Preventive care refers to lifestyle choices and visits with your health care provider that can promote health and wellness. Preventive care visits are also called wellness exams. What can I expect for my preventive care visit? Counseling During your preventive care visit, your health care provider may ask about your: Medical history, including: Past medical problems. Family medical history. Current health, including: Emotional well-being. Home life and relationship well-being. Sexual activity. Lifestyle, including: Alcohol, nicotine or tobacco, and drug use. Access to firearms. Diet, exercise, and sleep habits. Safety issues such as seatbelt and bike helmet use. Sunscreen use. Work and work Astronomer. Physical exam Your health care provider will check your: Height and weight. These may be used to calculate your BMI (body mass index). BMI is a measurement that tells if you are at a healthy weight. Waist circumference. This measures the distance around your waistline. This measurement also tells if you are at a healthy weight and may help predict  your risk of certain diseases, such as type 2 diabetes and high blood pressure. Heart rate and blood pressure. Body temperature. Skin for abnormal spots. What immunizations do I need?  Vaccines are usually given at various ages, according to a schedule. Your health care provider will recommend vaccines for you based on your age, medical history, and lifestyle or other factors, such as travel or where you  work. What tests do I need? Screening Your health care provider may recommend screening tests for certain conditions. This may include: Lipid and cholesterol levels. Diabetes screening. This is done by checking your blood sugar (glucose) after you have not eaten for a while (fasting). Hepatitis B test. Hepatitis C test. HIV (human immunodeficiency virus) test. STI (sexually transmitted infection) testing, if you are at risk. Lung cancer screening. Prostate cancer screening. Colorectal cancer screening. Talk with your health care provider about your test results, treatment options, and if necessary, the need for more tests. Follow these instructions at home: Eating and drinking  Eat a diet that includes fresh fruits and vegetables, whole grains, lean protein, and low-fat dairy products. Take vitamin and mineral supplements as recommended by your health care provider. Do not drink alcohol if your health care provider tells you not to drink. If you drink alcohol: Limit how much you have to 0-2 drinks a day. Know how much alcohol is in your drink. In the [place] one drink equals one 12 oz bottle of beer (355 mL), one 5 oz glass of wine (148 mL), or one 1 oz glass of hard liquor (44 mL). Lifestyle Brush your teeth every morning and night with fluoride toothpaste. Floss one time each day. Exercise for at least 30 minutes 5 or more days each week. Do not use any products that contain nicotine or tobacco. These products include cigarettes, chewing tobacco, and vaping devices, such as e-cigarettes. If you need help quitting, ask your health care provider. Do not use drugs. If you are sexually active, practice safe sex. Use a condom or other form of protection to prevent STIs. Take aspirin only as told by your health care provider. Make sure that you understand how much to take and what form to take. Work with your health care provider to find out whether it is safe and beneficial for you to take  aspirin daily. Find healthy ways to manage stress, such as: Meditation, yoga, or listening to music. Journaling. Talking to a trusted person. Spending time with friends and family. Minimize exposure to UV radiation to reduce your risk of skin cancer. Safety Always wear your seat belt while driving or riding in a vehicle. Do not drive: If you have been drinking alcohol. Do not ride with someone who has been drinking. When you are tired or distracted. While texting. If you have been using any mind-altering substances or drugs. Wear a helmet and other protective equipment during sports activities. If you have firearms in your house, make sure you follow all gun safety procedures. What's next? Go to your health care provider once a year for an annual wellness visit. Ask your health care provider how often you should have your eyes and teeth checked. Stay up to date on all vaccines. This information is not intended to replace advice given to you by your health care provider. Make sure you discuss any questions you have with your health care provider. Document Revised: 06/18/2020 Document Reviewed: 06/18/2020 Elsevier Patient Education  2024 [employer].

## 2022-11-24 NOTE — Progress Notes (Signed)
Because this visit was a virtual/telehealth visit,  certain criteria was not obtained, such a blood pressure, CBG if applicable, and timed get up and go. Any medications not marked as "taking" were not mentioned during the medication reconciliation part of the visit. Any vitals not documented were not able to be obtained due to this being a telehealth visit or patient was unable to self-report a recent blood pressure reading due to a lack of equipment at home via telehealth. Vitals that have been documented are verbally provided by the patient.   Subjective:   Timothy Blackwell is a 62 y.o. male who presents for Medicare Annual/Subsequent preventive examination.  Visit Complete: Virtual I connected with  Timothy Blackwell on 11/24/22 by a audio enabled telemedicine application and verified that I am speaking with the correct person using two identifiers.  Patient Location: Home  Provider Location: Home Office  I discussed the limitations of evaluation and management by telemedicine. The patient expressed understanding and agreed to proceed.  Vital Signs: Because this visit was a virtual/telehealth visit, some criteria may be missing or patient reported. Any vitals not documented were not able to be obtained and vitals that have been documented are patient reported.  Patient Medicare AWV questionnaire was completed by the patient on na; I have confirmed that all information answered by patient is correct and no changes since this date.  Cardiac Risk Factors include: advanced age (>39men, >57 women);male gender;sedentary lifestyle;smoking/ tobacco exposure     Objective:    Today's Vitals   11/24/22 1425  Weight: 180 lb (81.6 kg)  Height: 5\' 8"  (1.727 m)   Body mass index is 27.37 kg/m.     11/24/2022    2:30 PM 12/15/2020    4:43 PM  Advanced Directives  Does Patient Have a Medical Advance Directive? No No  Would patient like information on creating a medical advance directive? No -  Patient declined No - Patient declined    Current Medications (verified) Outpatient Encounter Medications as of 11/24/2022  Medication Sig   diclofenac (VOLTAREN) 75 MG EC tablet Take 1 tablet (75 mg total) by mouth 2 (two) times daily as needed for moderate pain. Take with food and a full glass of water (Patient not taking: Reported on 11/24/2022)   No facility-administered encounter medications on file as of 11/24/2022.    Allergies (verified) Patient has no known allergies.   History: Past Medical History:  Diagnosis Date   Depression    History reviewed. No pertinent surgical history. History reviewed. No pertinent family history. Social History   Socioeconomic History   Marital status: Married    Spouse name: Not on file   Number of children: Not on file   Years of education: Not on file   Highest education level: Not on file  Occupational History   Occupation: disability  Tobacco Use   Smoking status: Every Day    Current packs/day: 1.00    Types: Cigarettes   Smokeless tobacco: Never  Substance and Sexual Activity   Alcohol use: Not Currently   Drug use: Yes    Types: Marijuana, Hydrocodone   Sexual activity: Not Currently  Other Topics Concern   Not on file  Social History Narrative   Lives in a camper and ride a bicycle. No vehicle   Social Determinants of Health   Financial Resource Strain: Low Risk  (11/24/2022)   Overall Financial Resource Strain (CARDIA)    Difficulty of Paying Living Expenses: Not hard at all  Food Insecurity: No Food Insecurity (11/24/2022)   Hunger Vital Sign    Worried About Running Out of Food in the Last Year: Never true    Ran Out of Food in the Last Year: Never true  Transportation Needs: Unmet Transportation Needs (11/24/2022)   PRAPARE - Transportation    Lack of Transportation (Medical): Yes    Lack of Transportation (Non-Medical): Yes  Physical Activity: Sufficiently Active (11/24/2022)   Exercise Vital Sign     Days of Exercise per Week: 7 days    Minutes of Exercise per Session: 30 min  Stress: No Stress Concern Present (11/24/2022)   [name] of Occupational Health - Occupational Stress Questionnaire    Feeling of Stress : Only a little  Social Connections: Socially Isolated (11/24/2022)   Social Connection and Isolation Panel [NHANES]    Frequency of Communication with Friends and Family: More than three times a week    Frequency of Social Gatherings with Friends and Family: More than three times a week    Attends Religious Services: Never    Database administrator or Organizations: No    Attends Engineer, structural: Never    Marital Status: Divorced    Tobacco Counseling Ready to quit: Yes Counseling given: Yes   Clinical Intake:  Pre-visit preparation completed: Yes  Pain : No/denies pain     BMI - recorded: 27.37 Nutritional Status: BMI 25 -29 Overweight Nutritional Risks: None Diabetes: No  How often do you need to have someone help you when you read instructions, pamphlets, or other written materials from your doctor or pharmacy?: 1 - Never  Interpreter Needed?: No  Information entered by :: Abby W, CMA   Activities of Daily Living    11/24/2022    2:29 PM  In your present state of health, do you have any difficulty performing the following activities:  Hearing? 0  Vision? 0  Difficulty concentrating or making decisions? 0  Walking or climbing stairs? 0  Dressing or bathing? 0  Doing errands, shopping? 0  Preparing Food and eating ? N  Using the Toilet? N  In the past six months, have you accidently leaked urine? N  Do you have problems with loss of bowel control? N  Managing your Medications? N  Managing your Finances? N  Housekeeping or managing your Housekeeping? N    Patient Care Team: Raliegh Ip, DO as PCP - General (Family Medicine)  Indicate any recent Medical Services you may have received from other than [facility]  providers in the past year (date may be approximate).     Assessment:   This is a routine wellness examination for Timothy Blackwell.  Hearing/Vision screen Hearing Screening - Comments:: Patient states he does have difficulty hearing. He used to have hearing aids but didn't wear them because they made his ears hurt and caused migraines. Referral placed today for re-evaluation.  Vision Screening - Comments:: Wears rx glasses - up to date with routine eye exams  [facility]     Goals Addressed             This Visit's Progress    Patient Stated       Get a car       Depression Screen    11/24/2022    2:34 PM 08/09/2022    2:30 PM 06/26/2021    2:54 PM 12/17/2020    2:38 PM 12/15/2020    4:45 PM 11/24/2020    2:39 PM 04/16/2020  12:35 PM  PHQ 2/9 Scores  PHQ - 2 Score 0 0 3 0 1 1 6   PHQ- 9 Score 0 0 9 2 2 2 9     Fall Risk    11/24/2022    2:34 PM 08/09/2022    2:30 PM 12/15/2020    4:40 PM 11/24/2020    2:40 PM  Fall Risk   Falls in the past year? 0 0 1 0  Number falls in past yr: 0 0 1   Injury with Fall? 0 0 1   Risk for fall due to : No Fall Risks No Fall Risks History of fall(s);Orthopedic patient   Follow up Falls prevention discussed Education provided Falls prevention discussed;Education provided     MEDICARE RISK AT HOME: Medicare Risk at Home Any stairs in or around the home?: No If so, are there any without handrails?: No Home free of loose throw rugs in walkways, pet beds, electrical cords, etc?: Yes Adequate lighting in your home to reduce risk of falls?: Yes Life alert?: No Use of a cane, walker or w/c?: No Grab bars in the bathroom?: No Shower chair or bench in shower?: No Elevated toilet seat or a handicapped toilet?: No  TIMED UP AND GO:  Was the test performed?  No    Cognitive Function:        11/24/2022    2:34 PM 12/15/2020    4:36 PM  6CIT Screen  What Year? 0 points 0 points  What month? 0 points 0 points  What time? 0  points 0 points  Count back from 20 0 points 0 points  Months in reverse 0 points 4 points  Repeat phrase 0 points 2 points  Total Score 0 points 6 points    Immunizations Immunization History  Administered Date(s) Administered   Influenza Split 12/08/2013   Influenza,inj,Quad PF,6+ Mos 11/24/2020    TDAP status: Up to date  Flu Vaccine status: Due, Education has been provided regarding the importance of this vaccine. Advised may receive this vaccine at local pharmacy or Health Dept. Aware to provide a copy of the vaccination record if obtained from local pharmacy or Health Dept. Verbalized acceptance and understanding.  Pneumococcal vaccine status: Not age appropriate for this patient.   Covid-19 vaccine status: Information provided on how to obtain vaccines.   Qualifies for Shingles Vaccine? Yes   Zostavax completed No   Shingrix Completed?: No.    Education has been provided regarding the importance of this vaccine. Patient has been advised to call insurance company to determine out of pocket expense if they have not yet received this vaccine. Advised may also receive vaccine at local pharmacy or Health Dept. Verbalized acceptance and understanding.  Screening Tests Health Maintenance  Topic Date Due   HIV Screening  Never done   Hepatitis C Screening  Never done   DTaP/Tdap/Td (1 - Tdap) Never done   Colonoscopy  Never done   Zoster Vaccines- Shingrix (1 of 2) Never done   Medicare Annual Wellness (AWV)  12/15/2021   INFLUENZA VACCINE  08/05/2022   COVID-19 Vaccine (1 - 2023-24 season) Never done   HPV VACCINES  Aged Out    Health Maintenance  Health Maintenance Due  Topic Date Due   HIV Screening  Never done   Hepatitis C Screening  Never done   DTaP/Tdap/Td (1 - Tdap) Never done   Colonoscopy  Never done   Zoster Vaccines- Shingrix (1 of 2) Never done   Medicare Annual  Wellness (AWV)  12/15/2021   INFLUENZA VACCINE  08/05/2022   COVID-19 Vaccine (1 - 2023-24  season) Never done    Colorectal Cancer Screening: Cologuard ordered 11/24/2022. Patient verbalizes understanding and is in agreement with treatment plan.   Lung Cancer Screening: (Low Dose CT Chest recommended if Age 30-80 years, 20 pack-year currently smoking OR have quit w/in 15years.) does qualify.   Lung Cancer Screening Referral: patient declined. Has no transportation  Additional Screening:  Hepatitis C Screening: does qualify; Ordered 11/24/2022  Vision Screening: Recommended annual ophthalmology exams for early detection of glaucoma and other disorders of the eye. Is the patient up to date with their annual eye exam?  Yes  Who is the provider or what is the name of the office in which the patient attends annual eye exams? [facility] If pt is not established with a provider, would they like to be referred to a provider to establish care? No .   Dental Screening: Recommended annual dental exams for proper oral hygiene  Diabetic Foot Exam: na  Community Resource Referral / Chronic Care Management: CRR required this visit?  Yes   CCM required this visit?  No     Plan:     I have personally reviewed and noted the following in the patient's chart:   Medical and social history Use of alcohol, tobacco or illicit drugs  Current medications and supplements including opioid prescriptions. Patient is not currently taking opioid prescriptions. Functional ability and status Nutritional status Physical activity Advanced directives List of other physicians Hospitalizations, surgeries, and ER visits in previous 12 months Vitals Screenings to include cognitive, depression, and falls Referrals and appointments  In addition, I have reviewed and discussed with patient certain preventive protocols, quality metrics, and best practice recommendations. A written personalized care plan for preventive services as well as general preventive health recommendations were provided  to patient.     Timothy Blackwell, CMA   11/24/2022   After Visit Summary: (Mail) Due to this being a telephonic visit, the after visit summary with patients personalized plan was offered to patient via mail   Nurse Notes: see routing comment

## 2022-11-25 ENCOUNTER — Telehealth: Payer: Self-pay | Admitting: *Deleted

## 2022-11-25 NOTE — Telephone Encounter (Signed)
   Telephone encounter was:  Successful.  11/25/2022 Name: Timothy Blackwell MRN: [id number] DOB: July 17, 1960  Timothy Blackwell is a 62 y.o. year old male who is a primary care patient of No primary care provider on file. . The community resource team was consulted for assistance with Transportation Needs   Care guide performed the following interventions: Patient provided with information about care guide support team and interviewed to confirm resource needs. Put in referral for [facility] through [facility] also for [facility]IE[facility] and community resources Follow Up Plan:  No further follow up planned at this time. The patient has been provided with needed resources.  Dione Booze [facility]  Population Health Careguide  Direct Dial: [phone] Website: [email or web address]

## 2022-11-30 ENCOUNTER — Telehealth: Payer: Self-pay | Admitting: *Deleted

## 2022-12-07 DIAGNOSIS — Z1212 Encounter for screening for malignant neoplasm of rectum: Secondary | ICD-10-CM | POA: Diagnosis not present

## 2022-12-07 DIAGNOSIS — Z1211 Encounter for screening for malignant neoplasm of colon: Secondary | ICD-10-CM | POA: Diagnosis not present

## 2022-12-15 ENCOUNTER — Ambulatory Visit: Payer: Medicare HMO | Admitting: Family Medicine

## 2022-12-16 LAB — COLOGUARD: COLOGUARD: NEGATIVE

## 2022-12-21 ENCOUNTER — Ambulatory Visit: Payer: Medicare HMO | Admitting: Family Medicine

## 2023-02-17 ENCOUNTER — Ambulatory Visit: Payer: 59 | Admitting: Family Medicine

## 2023-02-17 ENCOUNTER — Ambulatory Visit (INDEPENDENT_AMBULATORY_CARE_PROVIDER_SITE_OTHER): Payer: 59

## 2023-02-17 ENCOUNTER — Encounter: Payer: Self-pay | Admitting: Family Medicine

## 2023-02-17 VITALS — BP 129/70 | HR 93 | Temp 98.4°F | Ht 68.0 in | Wt 196.6 lb

## 2023-02-17 DIAGNOSIS — G8929 Other chronic pain: Secondary | ICD-10-CM

## 2023-02-17 DIAGNOSIS — M25562 Pain in left knee: Secondary | ICD-10-CM | POA: Diagnosis not present

## 2023-02-17 NOTE — Progress Notes (Signed)
   Acute Office Visit  Subjective:     Patient ID: Timothy Blackwell, male    DOB: 22-Aug-1960, 63 y.o.   MRN: [id number]  Chief Complaint  Patient presents with   Knee Pain    Knee Pain  Incident onset: 6 months ago. The injury mechanism was a direct blow (hit against car door). The pain is present in the left knee. The quality of the pain is described as aching, burning, shooting and stabbing. The pain is moderate. The pain has been Constant since onset. Associated symptoms include tingling (anterior). Pertinent negatives include no loss of motion, loss of sensation or numbness. Associated symptoms comments: catching. The symptoms are aggravated by movement and weight bearing. He has tried ice, elevation, heat, acetaminophen, NSAIDs, immobilization and rest for the symptoms. The treatment provided no relief.    Review of Systems  Neurological:  Positive for tingling (anterior). Negative for numbness.        Objective:    BP 129/70   Pulse 93   Temp 98.4 F (36.9 C) (Temporal)   Ht 5\' 8"  (1.727 m)   Wt 196 lb 9.6 oz (89.2 kg)   SpO2 94%   BMI 29.89 kg/m    Physical Exam Vitals and nursing note reviewed.  Constitutional:      General: He is not in acute distress.    Appearance: He is not ill-appearing, toxic-appearing or diaphoretic.  Musculoskeletal:     Left knee: No swelling, deformity, effusion, erythema or bony tenderness. Decreased range of motion. Tenderness present over the medial joint line and lateral joint line. No patellar tendon tenderness. Normal patellar mobility.     Instability Tests: Medial McMurray test negative and lateral McMurray test negative.     Right lower leg: No edema.     Left lower leg: No edema.  Skin:    General: Skin is warm and dry.  Neurological:     General: No focal deficit present.     Mental Status: He is alert and oriented to person, place, and time.  Psychiatric:        Mood and Affect: Mood normal.        Behavior: Behavior  normal.     No results found for any visits on 02/17/23.      Assessment & Plan:   Timothy Blackwell was seen today for knee pain.  Diagnoses and all orders for this visit:  Chronic pain of left knee Uncontrolled. Pain x 6 months after injury. Failed conservative therapy. Xray today in office. No acute findings noted, radiology report pending. Referral to ortho discussed and placed.  -     DG Knee 1-2 Views Left; Future -     Ambulatory referral to Orthopedic Surgery  The patient indicates understanding of these issues and agrees with the plan.  Gabriel Earing, FNP

## 2023-03-08 ENCOUNTER — Other Ambulatory Visit (HOSPITAL_COMMUNITY): Payer: Self-pay | Admitting: Orthopedic Surgery

## 2023-03-08 DIAGNOSIS — M25561 Pain in right knee: Secondary | ICD-10-CM | POA: Diagnosis not present

## 2023-03-08 DIAGNOSIS — M25562 Pain in left knee: Secondary | ICD-10-CM

## 2023-03-22 ENCOUNTER — Ambulatory Visit: Payer: 59 | Admitting: Family Medicine

## 2023-03-22 ENCOUNTER — Encounter: Payer: Self-pay | Admitting: Family Medicine

## 2023-03-22 VITALS — BP 111/83 | HR 81 | Temp 98.3°F | Ht 68.0 in | Wt 190.0 lb

## 2023-03-22 DIAGNOSIS — Z23 Encounter for immunization: Secondary | ICD-10-CM

## 2023-03-22 DIAGNOSIS — R6889 Other general symptoms and signs: Secondary | ICD-10-CM | POA: Diagnosis not present

## 2023-03-22 DIAGNOSIS — Z136 Encounter for screening for cardiovascular disorders: Secondary | ICD-10-CM | POA: Diagnosis not present

## 2023-03-22 DIAGNOSIS — Z1159 Encounter for screening for other viral diseases: Secondary | ICD-10-CM

## 2023-03-22 DIAGNOSIS — M25562 Pain in left knee: Secondary | ICD-10-CM | POA: Diagnosis not present

## 2023-03-22 DIAGNOSIS — Z6828 Body mass index (BMI) 28.0-28.9, adult: Secondary | ICD-10-CM

## 2023-03-22 DIAGNOSIS — G5601 Carpal tunnel syndrome, right upper limb: Secondary | ICD-10-CM | POA: Diagnosis not present

## 2023-03-22 DIAGNOSIS — R351 Nocturia: Secondary | ICD-10-CM

## 2023-03-22 DIAGNOSIS — R739 Hyperglycemia, unspecified: Secondary | ICD-10-CM

## 2023-03-22 DIAGNOSIS — F17200 Nicotine dependence, unspecified, uncomplicated: Secondary | ICD-10-CM

## 2023-03-22 DIAGNOSIS — Z0001 Encounter for general adult medical examination with abnormal findings: Secondary | ICD-10-CM

## 2023-03-22 DIAGNOSIS — Z0000 Encounter for general adult medical examination without abnormal findings: Secondary | ICD-10-CM

## 2023-03-22 DIAGNOSIS — E663 Overweight: Secondary | ICD-10-CM

## 2023-03-22 MED ORDER — NICOTINE 21 MG/24HR TD PT24: 21.0000 mg | MEDICATED_PATCH | Freq: Every day | TRANSDERMAL | 0 refills | Status: AC

## 2023-03-22 MED ORDER — NICOTINE 7 MG/24HR TD PT24: 7.0000 mg | MEDICATED_PATCH | Freq: Every day | TRANSDERMAL | 0 refills | Status: AC

## 2023-03-22 MED ORDER — NICOTINE 14 MG/24HR TD PT24: 14.0000 mg | MEDICATED_PATCH | Freq: Every day | TRANSDERMAL | 0 refills | Status: AC

## 2023-03-22 NOTE — Patient Instructions (Signed)
 Dental list          reviewed 03.08.24 Many of these dentists accept Medicaid.  The list is for your convenience in choosing your child's dentist. Estos dentistas aceptan Medicaid.  La lista es para su [place] y es una cortesa.     [facility]     [phone] [address]  [address] [place] [address] Se habla espaol From 70 to 61 years old Parent may go with child Vinson Moselle DDS     [phone] [address] [place]  [address] Se habla espaol From 96 to 31 years old Parent may NOT go with child  [facility]    [phone] [address] [place] [address] No se habla espaol From birth Parent may not go with child  [facility]     [phone] [address] [place] [address] Se habla espaol From 11 to 33 years old Parent may NOT go with child  Winfield Rast DDS     [phone] [facility]      [address]  [place] [address] No se habla espaol From teeth coming in Parent may go with child  [facility]     [phone] [address] [place]. [place] [address] Requires certification. Call for information. Requiere certificacin. Llame para informacin. Algunos dias se habla espaol  From birth to 20 years Parent possibly goes with child  Bradd Canary DDS     [phone] [address] [place]  [address] [place] [address] Se habla espaol From 18 months to 18 years  Parent may go with child  J. [place]ia DDS    [phone] Garlon Hatchet DDS [address] [place] [address] Se habla espaol From 73 year old Parent may go with child  Melynda Ripple DDS    [phone] [address] [place] [address] Se habla espaol  From 55 months old Parent may go with child Dorian Pod DDS    [phone] [address] [place] [address] Se habla espaol From 26 to 61 years old Parent may go with child

## 2023-03-22 NOTE — Progress Notes (Signed)
 Timothy Blackwell is a 63 y.o. male presents to office today for annual physical exam examination and to established with new provider   Concerns today include: 1. Right knee pain for which he was seen 2 weeks ago by Harlow Mares, FNP at [facility]. He was referred to ortho at that time and is planning for MRI. He is waiting on MRI to be scheduled. States that he called them to inquire of MRI. Waiting for call back. He is wearing brace.   Reports history of carpal tunnel in right wrist. States that he has stimulator that helps him. Placed in 2012 or 2013, he is unsure. He had it placed while he was living in [place], states that it was done at [place].    Patient has a history of depression, anxiety, kidney stones   Occupation: disabled  Marital status: no concerns for STDs, in relationship Diet: "anything I want"  Lots of fruit and milk  Exercise: none currently due to knee pain  Substance use: tobacco  Smokes 1/2 PPD  Started smoking at 63 years old  Quit for a few years and then restarted 7 years ago.  Marijuana - once per day  States that he is trying to quit, but has life stressors  Last eye exam: due - has appt April 15th  Last dental exam: does not have one.  Last colonoscopy: completed December 2024 PSA: not that he knows of.  Nocturia - states that he gets up 5 times per night.  Refills needed today: none  Other specialists seen: ortho Dermatology exam: declines  Fasting today:  no  Immunizations needed: Flu Vaccine: yes  Tdap Vaccine: due in 10/2032  - every 32yrs - (<3 lifetime doses or unknown): all wounds -- look up need for Tetanus IG - (>=3 lifetime doses): clean/minor wound if >64yrs from previous; all other wounds if >79yrs from previous Zoster Vaccine: declines (those >50yo, once) Pneumonia Vaccine: declines (those w/ risk factors) - (<40yr) Both: Immunocompromised, cochlear implant, CSF leak, asplenic, sickle cell, Chronic Renal Failure - (<53yr) PPSV-23 only: Heart  dz, lung disease, DM, tobacco abuse, alcoholism, cirrhosis/liver disease. - (>50yr): PPSV13 then PPSV23 in 6-12mths;  - (>30yr): repeat PPSV23 once if pt received prior to 63yo and 20yrs have passed  Past Medical History:  Diagnosis Date   Depression    Social History   Socioeconomic History   Marital status: Married    Spouse name: Not on file   Number of children: Not on file   Years of education: Not on file   Highest education level: Not on file  Occupational History   Occupation: disability  Tobacco Use   Smoking status: Every Day    Current packs/day: 1.00    Average packs/day: 1 pack/day for 45.2 years (45.2 ttl pk-yrs)    Types: Cigarettes    Start date: 15   Smokeless tobacco: Never  Substance and Sexual Activity   Alcohol use: Not Currently   Drug use: Yes    Types: Marijuana, Hydrocodone   Sexual activity: Not Currently  Other Topics Concern   Not on file  Social History Narrative   Lives in a camper and ride a bicycle. No vehicle   Social Drivers of Corporate investment banker Strain: Low Risk  (11/24/2022)   Overall Financial Resource Strain (CARDIA)    Difficulty of Paying Living Expenses: Not hard at all  Food Insecurity: No Food Insecurity (11/24/2022)   Hunger Vital Sign    Worried About  Running Out of Food in the Last Year: Never true    Ran Out of Food in the Last Year: Never true  Transportation Needs: Unmet Transportation Needs (11/25/2022)   PRAPARE - Transportation    Lack of Transportation (Medical): Yes    Lack of Transportation (Non-Medical): Yes  Physical Activity: Sufficiently Active (11/24/2022)   Exercise Vital Sign    Days of Exercise per Week: 7 days    Minutes of Exercise per Session: 30 min  Stress: No Stress Concern Present (11/24/2022)   [name] of Occupational Health - Occupational Stress Questionnaire    Feeling of Stress : Only a little  Social Connections: Socially Isolated (11/24/2022)   Social Connection and  Isolation Panel [NHANES]    Frequency of Communication with Friends and Family: More than three times a week    Frequency of Social Gatherings with Friends and Family: More than three times a week    Attends Religious Services: Never    Database administrator or Organizations: No    Attends Banker Meetings: Never    Marital Status: Divorced  Catering manager Violence: Not At Risk (11/24/2022)   Humiliation, Afraid, Rape, and Kick questionnaire    Fear of Current or Ex-Partner: No    Emotionally Abused: No    Physically Abused: No    Sexually Abused: No   History reviewed. No pertinent surgical history. History reviewed. No pertinent family history. No current outpatient medications on file.  No Known Allergies   ROS: Review of Systems Review of Systems  All other systems reviewed and are negative.    Physical exam    03/22/2023   11:07 AM 02/17/2023    1:38 PM 11/24/2022    2:25 PM  Vitals with BMI  Height 5\' 8"  5\' 8"  5\' 8"   Weight 190 lbs 196 lbs 10 oz 180 lbs  BMI 28.9 29.9 27.38  Systolic 111 129 --  Diastolic 83 70 --  Pulse 81 93     Physical Exam Constitutional:      General: He is awake. He is not in acute distress.    Appearance: Normal appearance. He is well-developed, well-groomed and overweight. He is not ill-appearing, toxic-appearing or diaphoretic.     Interventions: He is not intubated. HENT:     Head:     Salivary Glands: Right salivary gland is not diffusely enlarged or tender. Left salivary gland is not diffusely enlarged or tender.     Right Ear: No laceration, drainage, swelling or tenderness. No middle ear effusion. There is no impacted cerumen. No foreign body. No mastoid tenderness. No PE tube. No hemotympanum. Tympanic membrane is injected. Tympanic membrane is not scarred, perforated, erythematous, retracted or bulging.     Left Ear: No laceration, drainage, swelling or tenderness.  No middle ear effusion. There is no impacted  cerumen. No foreign body. No mastoid tenderness. No PE tube. No hemotympanum. Tympanic membrane is injected. Tympanic membrane is not scarred, perforated, erythematous, retracted or bulging.     Nose: No nasal deformity, septal deviation, signs of injury, laceration, nasal tenderness, mucosal edema, congestion or rhinorrhea.     Right Sinus: No maxillary sinus tenderness or frontal sinus tenderness.     Left Sinus: No maxillary sinus tenderness or frontal sinus tenderness.  Eyes:     General: Lids are normal.     Extraocular Movements:     Right eye: Normal extraocular motion.     Left eye: Normal extraocular motion.  Conjunctiva/sclera:     Right eye: Right conjunctiva is not injected. No chemosis, exudate or hemorrhage.    Left eye: Left conjunctiva is not injected. No chemosis, exudate or hemorrhage.    Pupils: Pupils are equal, round, and reactive to light. Pupils are equal.     Right eye: Pupil is round, reactive and not sluggish. No corneal abrasion.     Left eye: Pupil is round, reactive and not sluggish. No corneal abrasion.     Funduscopic exam:    Right eye: No hemorrhage or exudate. Red reflex present.        Left eye: No hemorrhage or exudate. Red reflex present. Neck:     Thyroid: No thyroid mass or thyromegaly.     Vascular: No carotid bruit.     Trachea: Trachea and phonation normal. No tracheal tenderness, tracheostomy or tracheal deviation.  Cardiovascular:     Rate and Rhythm: Normal rate and regular rhythm.     Pulses:          Radial pulses are 2+ on the right side and 2+ on the left side.     Heart sounds: Normal heart sounds.  Pulmonary:     Effort: Pulmonary effort is normal. No tachypnea, bradypnea, accessory muscle usage, prolonged expiration, respiratory distress or retractions. He is not intubated.     Breath sounds: Normal breath sounds. No stridor, decreased air movement or transmitted upper airway sounds. No decreased breath sounds, wheezing, rhonchi or  rales.  Abdominal:     General: Abdomen is flat. Bowel sounds are normal. There is no distension or abdominal bruit. There are no signs of injury.     Palpations: Abdomen is soft. There is no shifting dullness, fluid wave, hepatomegaly, splenomegaly, mass or pulsatile mass.     Tenderness: There is no abdominal tenderness. There is no right CVA tenderness, left CVA tenderness, guarding or rebound.     Hernia: No hernia is present.  Musculoskeletal:     Cervical back: Full passive range of motion without pain, normal range of motion and neck supple. No edema, erythema, rigidity, torticollis or crepitus. No pain with movement. Normal range of motion.     Right lower leg: No edema.     Left lower leg: No edema.  Lymphadenopathy:     Head:     Right side of head: No submental, submandibular, tonsillar, preauricular or posterior auricular adenopathy.     Left side of head: No submental, submandibular, tonsillar, preauricular or posterior auricular adenopathy.     Cervical: No cervical adenopathy.     Right cervical: No superficial, deep or posterior cervical adenopathy.    Left cervical: No superficial, deep or posterior cervical adenopathy.  Skin:    General: Skin is warm.     Capillary Refill: Capillary refill takes less than 2 seconds.  Neurological:     General: No focal deficit present.     Mental Status: He is alert, oriented to person, place, and time and easily aroused. Mental status is at baseline.     Cranial Nerves: Cranial nerves 2-12 are intact. No cranial nerve deficit or facial asymmetry.     Motor: Motor function is intact.     Gait: Gait is intact.  Psychiatric:        Attention and Perception: Attention and perception normal.        Mood and Affect: Mood and affect normal.        Speech: Speech normal.  Behavior: Behavior normal. Behavior is cooperative.        Thought Content: Thought content normal.        Cognition and Memory: Cognition and memory normal.         Judgment: Judgment normal.        03/22/2023   11:09 AM 02/17/2023    1:55 PM 11/24/2022    2:34 PM  Depression screen PHQ 2/9  Decreased Interest 2 0 0  Down, Depressed, Hopeless 0 0 0  PHQ - 2 Score 2 0 0  Altered sleeping 0 0 0  Tired, decreased energy 2 0 0  Change in appetite 0 0 0  Feeling bad or failure about yourself  0 0 0  Trouble concentrating 1 0 0  Moving slowly or fidgety/restless 0 0 0  Suicidal thoughts 0 0 0  PHQ-9 Score 5 0 0  Difficult doing work/chores  Not difficult at all Not difficult at all      03/22/2023   11:13 AM 02/17/2023    1:56 PM 08/09/2022    2:31 PM 06/26/2021    2:57 PM  GAD 7 : Generalized Anxiety Score  Nervous, Anxious, on Edge 1 0 0 3  Control/stop worrying 0 0 0 3  Worry too much - different things 0 0 0 3  Trouble relaxing 0 0 0 1  Restless 1 0 0 0  Easily annoyed or irritable 0 0 0 1  Afraid - awful might happen 0 0 0 2  Total GAD 7 Score 2 0 0 13  Anxiety Difficulty Somewhat difficult Not difficult at all Not difficult at all Very difficult    Assessment/ Plan: Virgil Benedict here for annual physical exam.   1. Encounter for general adult medical examination without abnormal findings (Primary) Discussed with patient to continue healthy lifestyle choices, including diet (rich in fruits, vegetables, and lean proteins, and low in salt and simple carbohydrates) and exercise (at least 30 minutes of moderate physical activity daily). Limit beverages high is sugar. Recommended at least 80-100 oz of water daily.   2. Encounter for screening for cardiovascular disorders Labs as below. Will communicate results to patient once available. Will await results to determine next steps.  Not fasting  - Lipid panel  3. Nocturia Labs as below. Will communicate results to patient once available. Will await results to determine next steps.  - PSA, total and free  4. Tobacco use disorder Labs as below. Will communicate results to patient once  available. Will await results to determine next steps.  Will start medication as below as patient has desire to quit.  Referral placed as below for further evaluation for lung cancer screening  - CBC with Differential/Platelet - CMP14+EGFR - Ambulatory Referral Lung Cancer Screening [facility] - nicotine (NICODERM CQ) 21 mg/24hr patch; Place 1 patch (21 mg total) onto the skin daily.  Dispense: 42 patch; Refill: 0 - nicotine (NICODERM CQ) 14 mg/24hr patch; Place 1 patch (14 mg total) onto the skin daily.  Dispense: 14 patch; Refill: 0 - nicotine (NICODERM CQ) 7 mg/24hr patch; Place 1 patch (7 mg total) onto the skin daily.  Dispense: 14 patch; Refill: 0  5. Overweight (BMI 25.0-29.9) Labs as below. Will communicate results to patient once available. Will await results to determine next steps.  - TSH  6. Screening for viral disease Labs as below. Will communicate results to patient once available. Will await results to determine next steps.  - HepB+HepC+HIV Panel  7. Acute  pain of left knee Patient to continue to follow up with orthopedics. Awaiting MRI.   8. Carpal tunnel syndrome of right wrist Stable. Patient to try to determine where he completed procedure previously so that we can follow up.   9. Encounter for immunization - Flu vaccine trivalent PF, 6mos and older(Flulaval,Afluria,Fluarix,Fluzone)   Patient to follow up in 1 year for annual exam or sooner if needed.  The above assessment and management plan was discussed with the patient. The patient verbalized understanding of and has agreed to the management plan. Patient is aware to call the clinic if symptoms persist or worsen. Patient is aware when to return to the clinic for a follow-up visit. Patient educated on when it is appropriate to go to the emergency department.   Neale Burly, DNP-FNP [facility] [address] [place], [place] [address] [phone]

## 2023-03-23 ENCOUNTER — Ambulatory Visit: Payer: Self-pay | Admitting: Family Medicine

## 2023-03-23 LAB — CMP14+EGFR
ALT: 44 IU/L (ref 0–44)
AST: 29 IU/L (ref 0–40)
Albumin: 4.5 g/dL (ref 3.8–4.9)
Alkaline Phosphatase: 175 IU/L — ABNORMAL HIGH (ref 44–121)
BUN/Creatinine Ratio: 15 (ref 10–24)
BUN: 25 mg/dL (ref 8–27)
Bilirubin Total: 0.3 mg/dL (ref 0.0–1.2)
CO2: 22 mmol/L (ref 20–29)
Calcium: 9.5 mg/dL (ref 8.6–10.2)
Chloride: 90 mmol/L — ABNORMAL LOW (ref 96–106)
Creatinine, Ser: 1.65 mg/dL — ABNORMAL HIGH (ref 0.76–1.27)
Globulin, Total: 2.4 g/dL (ref 1.5–4.5)
Glucose: 559 mg/dL (ref 70–99)
Potassium: 5.6 mmol/L — ABNORMAL HIGH (ref 3.5–5.2)
Sodium: 127 mmol/L — ABNORMAL LOW (ref 134–144)
Total Protein: 6.9 g/dL (ref 6.0–8.5)
eGFR: 47 mL/min/{1.73_m2} — ABNORMAL LOW (ref >59)

## 2023-03-23 LAB — LIPID PANEL
Chol/HDL Ratio: 7.1 ratio — ABNORMAL HIGH (ref 0.0–5.0)
Cholesterol, Total: 199 mg/dL (ref 100–199)
HDL: 28 mg/dL — ABNORMAL LOW (ref >39)
LDL Chol Calc (NIH): 99 mg/dL (ref 0–99)
Triglycerides: 428 mg/dL — ABNORMAL HIGH (ref 0–149)
VLDL Cholesterol Cal: 72 mg/dL — ABNORMAL HIGH (ref 5–40)

## 2023-03-23 LAB — CBC WITH DIFFERENTIAL/PLATELET
Basophils Absolute: 0.1 10*3/uL (ref 0.0–0.2)
Basos: 1 %
EOS (ABSOLUTE): 0.5 10*3/uL — ABNORMAL HIGH (ref 0.0–0.4)
Eos: 4 %
Hematocrit: 51.8 % — ABNORMAL HIGH (ref 37.5–51.0)
Hemoglobin: 17.2 g/dL (ref 13.0–17.7)
Immature Grans (Abs): 0 10*3/uL (ref 0.0–0.1)
Immature Granulocytes: 0 %
Lymphocytes Absolute: 2.7 10*3/uL (ref 0.7–3.1)
Lymphs: 21 %
MCH: 28.9 pg (ref 26.6–33.0)
MCHC: 33.2 g/dL (ref 31.5–35.7)
MCV: 87 fL (ref 79–97)
Monocytes Absolute: 0.8 10*3/uL (ref 0.1–0.9)
Monocytes: 6 %
Neutrophils Absolute: 8.8 10*3/uL — ABNORMAL HIGH (ref 1.4–7.0)
Neutrophils: 68 %
Platelets: 384 10*3/uL (ref 150–450)
RBC: 5.95 x10E6/uL — ABNORMAL HIGH (ref 4.14–5.80)
RDW: 13.3 % (ref 11.6–15.4)
WBC: 12.8 10*3/uL — ABNORMAL HIGH (ref 3.4–10.8)

## 2023-03-23 LAB — HEPB+HEPC+HIV PANEL
HIV Screen 4th Generation wRfx: NONREACTIVE
Hep B C IgM: NEGATIVE
Hep B Core Total Ab: NEGATIVE
Hep B E Ab: NONREACTIVE
Hep B E Ag: NEGATIVE
Hep B Surface Ab, Qual: NONREACTIVE
Hep C Virus Ab: NONREACTIVE
Hepatitis B Surface Ag: NEGATIVE

## 2023-03-23 LAB — PSA, TOTAL AND FREE
PSA, Free Pct: 35.8 %
PSA, Free: 0.43 ng/mL
Prostate Specific Ag, Serum: 1.2 ng/mL (ref 0.0–4.0)

## 2023-03-23 LAB — TSH: TSH: 2.96 u[IU]/mL (ref 0.450–4.500)

## 2023-03-23 NOTE — Telephone Encounter (Signed)
 Patient unable to come in the morning due to ride situation.

## 2023-03-23 NOTE — Telephone Encounter (Addendum)
 Critical Lab Result called in from Summer from [facility].  Glucose: 559  CAL Notified  Copied from CRM [id number]. Topic: Clinical - Lab/Test Results >> Mar 23, 2023  3:11 PM Everette C wrote: Reason for CRM: Summer with [facility] is calling to give critical labs for the patient Reason for Disposition  Lab or radiology calling with CRITICAL test results  Answer Assessment - Initial Assessment Questions 1. REASON FOR CALL or QUESTION: "What is your reason for calling today?" or "How can I best help you?" or "What question do you have that I can help answer?"     Critical Result  Answer Assessment - Initial Assessment Questions 1. REASON FOR CALL or QUESTION: "What is your reason for calling today?" or "How can I best help you?" or "What question do you have that I can help answer?"     Critical Result 2. CALLER: Document the source of call. (e.g., laboratory, patient).     [facility]  Protocols used: Information Only Call - No Triage-A-AH, PCP Call - No Triage-A-AH

## 2023-03-23 NOTE — Addendum Note (Signed)
 Addended by: Neale Burly on: 03/23/2023 08:09 AM   Modules accepted: Orders

## 2023-03-23 NOTE — Telephone Encounter (Signed)
 Patient is on lab schedule for tomorrow. If he can come in the morning, I would prefer that so I can get stat labs back before end of day.

## 2023-03-23 NOTE — Progress Notes (Signed)
 Please have patient come back in for stat CMP, A1C, and urinalysis.

## 2023-03-24 ENCOUNTER — Telehealth: Payer: Self-pay | Admitting: Family Medicine

## 2023-03-24 ENCOUNTER — Other Ambulatory Visit

## 2023-03-24 ENCOUNTER — Encounter: Payer: Self-pay | Admitting: Family Medicine

## 2023-03-24 DIAGNOSIS — E1169 Type 2 diabetes mellitus with other specified complication: Secondary | ICD-10-CM

## 2023-03-24 DIAGNOSIS — R739 Hyperglycemia, unspecified: Secondary | ICD-10-CM | POA: Diagnosis not present

## 2023-03-24 DIAGNOSIS — Z720 Tobacco use: Secondary | ICD-10-CM | POA: Insufficient documentation

## 2023-03-24 LAB — CMP14+EGFR
ALT: 54 IU/L — ABNORMAL HIGH (ref 0–44)
AST: 35 IU/L (ref 0–40)
Albumin: 4.7 g/dL (ref 3.8–4.9)
Alkaline Phosphatase: 138 IU/L — ABNORMAL HIGH (ref 44–121)
BUN/Creatinine Ratio: 13 (ref 10–24)
BUN: 20 mg/dL (ref 8–27)
Bilirubin Total: 0.6 mg/dL (ref 0.0–1.2)
CO2: 24 mmol/L (ref 20–29)
Calcium: 9.9 mg/dL (ref 8.6–10.2)
Chloride: 93 mmol/L — ABNORMAL LOW (ref 96–106)
Creatinine, Ser: 1.58 mg/dL — ABNORMAL HIGH (ref 0.76–1.27)
Globulin, Total: 1.9 g/dL (ref 1.5–4.5)
Glucose: 446 mg/dL (ref 70–99)
Potassium: 5 mmol/L (ref 3.5–5.2)
Sodium: 130 mmol/L — ABNORMAL LOW (ref 134–144)
Total Protein: 6.6 g/dL (ref 6.0–8.5)
eGFR: 50 mL/min/{1.73_m2} — ABNORMAL LOW (ref >59)

## 2023-03-24 LAB — URINALYSIS, ROUTINE W REFLEX MICROSCOPIC
Bilirubin, UA: NEGATIVE
Glucose, UA: NEGATIVE
Leukocytes,UA: NEGATIVE
Nitrite, UA: NEGATIVE
Specific Gravity, UA: 1.01 (ref 1.005–1.030)
Urobilinogen, Ur: 0.2 mg/dL (ref 0.2–1.0)
pH, UA: 5 (ref 5.0–7.5)

## 2023-03-24 LAB — MICROSCOPIC EXAMINATION
Bacteria, UA: NONE SEEN
Renal Epithel, UA: NONE SEEN /HPF
Yeast, UA: NONE SEEN

## 2023-03-24 LAB — BAYER DCA HB A1C WAIVED: HB A1C (BAYER DCA - WAIVED): 13 % — ABNORMAL HIGH (ref 4.8–5.6)

## 2023-03-24 LAB — GLUCOSE HEMOCUE WAIVED: Glu Hemocue Waived: 440 mg/dL — ABNORMAL HIGH (ref 70–99)

## 2023-03-24 MED ORDER — METFORMIN HCL 500 MG PO TABS: 500.0000 mg | ORAL_TABLET | Freq: Two times a day (BID) | ORAL | 0 refills | Status: DC

## 2023-03-24 NOTE — Telephone Encounter (Signed)
 Pt. States "A nurse called me, but we got disconnected.It was about my labs." Warm transfer to [facility] in the practice.

## 2023-03-24 NOTE — Telephone Encounter (Signed)
 Shaun called from [employer] [employer] with Critical lab result.  Glucose 446

## 2023-03-24 NOTE — Progress Notes (Signed)
 Patient has new uncontrolled diabetes. Will send in metformin to start. Start with 500 mg twice daily. Sodium is also low, improving slightly. Would like to get patient scheduled for new diabetes appt with repeat labs, 30 minute appt next week, Tuesday if possible. If patient has any changes to vision, confusion, dizziness, recommend he present to ED. Will plan to start GLP1 at appt as well. In addition, reduced GFR and elevated creatinine indicate renal disease. Recommend patient avoid NSAID, increase hydration and will repeat labs.

## 2023-03-24 NOTE — Telephone Encounter (Signed)
 See result note.

## 2023-03-24 NOTE — Addendum Note (Signed)
 Addended by: Angela Nevin D on: 03/24/2023 08:33 AM   Modules accepted: Orders

## 2023-03-29 ENCOUNTER — Ambulatory Visit (INDEPENDENT_AMBULATORY_CARE_PROVIDER_SITE_OTHER): Admitting: Family Medicine

## 2023-03-29 ENCOUNTER — Encounter: Payer: Self-pay | Admitting: Family Medicine

## 2023-03-29 VITALS — BP 119/68 | HR 94 | Temp 98.9°F | Ht 68.0 in | Wt 188.0 lb

## 2023-03-29 DIAGNOSIS — E1165 Type 2 diabetes mellitus with hyperglycemia: Secondary | ICD-10-CM | POA: Insufficient documentation

## 2023-03-29 DIAGNOSIS — Z7984 Long term (current) use of oral hypoglycemic drugs: Secondary | ICD-10-CM

## 2023-03-29 DIAGNOSIS — E119 Type 2 diabetes mellitus without complications: Secondary | ICD-10-CM | POA: Diagnosis not present

## 2023-03-29 DIAGNOSIS — E1169 Type 2 diabetes mellitus with other specified complication: Secondary | ICD-10-CM | POA: Diagnosis not present

## 2023-03-29 DIAGNOSIS — E1129 Type 2 diabetes mellitus with other diabetic kidney complication: Secondary | ICD-10-CM

## 2023-03-29 DIAGNOSIS — R809 Proteinuria, unspecified: Secondary | ICD-10-CM | POA: Diagnosis not present

## 2023-03-29 DIAGNOSIS — L84 Corns and callosities: Secondary | ICD-10-CM | POA: Diagnosis not present

## 2023-03-29 MED ORDER — BLOOD GLUCOSE TEST VI STRP: 1.0000 | ORAL_STRIP | Freq: Three times a day (TID) | 0 refills | Status: DC

## 2023-03-29 MED ORDER — LANCET DEVICE MISC: 1.0000 | Freq: Three times a day (TID) | 0 refills | Status: AC

## 2023-03-29 MED ORDER — LANCETS MISC. MISC: 1.0000 | Freq: Three times a day (TID) | 0 refills | Status: AC

## 2023-03-29 MED ORDER — BLOOD GLUCOSE MONITORING SUPPL DEVI: 1.0000 | Freq: Three times a day (TID) | 0 refills | Status: AC

## 2023-03-29 MED ORDER — TIRZEPATIDE 2.5 MG/0.5ML ~~LOC~~ SOAJ: 2.5000 mg | SUBCUTANEOUS | 0 refills | Status: AC

## 2023-03-29 NOTE — Patient Instructions (Addendum)
 CHECK Blood sugar once daily in AM before eating   Dental list          reviewed 03.08.24 Many of these dentists accept Medicaid.  The list is for your convenience in choosing your child's dentist. Estos dentistas aceptan Medicaid.  La lista es para su [place] y es una cortesa.     [facility]     [phone] [address]  [address] [place] [address] Se habla espaol From 79 to 61 years old Parent may go with child Vinson Moselle DDS     [phone] [address] [place]  [address] Se habla espaol From 5 to 39 years old Parent may NOT go with child  [facility]    [phone] [address] [place] [address] No se habla espaol From birth Parent may not go with child  [facility]     [phone] [address] [place] [address] Se habla espaol From 71 to 58 years old Parent may NOT go with child  Winfield Rast DDS     [phone] [facility]      [address]  [place] [address] No se habla espaol From teeth coming in Parent may go with child  [facility]     [phone] [address] [place]. [place] [address] Requires certification. Call for information. Requiere certificacin. Llame para informacin. Algunos dias se habla espaol  From birth to 20 years Parent possibly goes with child  Bradd Canary DDS     [phone] [address] [place]  [address] [place] [address] Se habla espaol From 18 months to 18 years  Parent may go with child  [facility]    [phone] Garlon Hatchet DDS [address] [place] [address] Se habla espaol From 20 year old Parent may go with child  Melynda Ripple DDS    [phone] [address] [place] [address] Se habla espaol  From 55 months old Parent may go with child Dorian Pod DDS    [phone] [address] [place] [address] Se habla espaol From 64 to 67  years old Parent may go with child

## 2023-03-29 NOTE — Progress Notes (Signed)
 Subjective:  Patient ID: Timothy Blackwell, male    DOB: 12/21/1960, 63 y.o.   MRN: [id number]  Patient Care Team: Arrie Senate, FNP as PCP - General (Family Medicine)   Chief Complaint:  Medical Management of Chronic Issues (New diabetic mgmt)   HPI: Timothy Blackwell is a 63 y.o. male presenting on 03/29/2023 for Medical Management of Chronic Issues (New diabetic mgmt)  HPI 1. Type 2 diabetes mellitus with other specified complication, without long-term current use of insulin [facility]) Patient presents today to discuss new diagnosis of Diabetes. Reports that he was told at one time he had diabetes, ~9 years ago when he was living in [place] but does not remember if they started any medications. Reports that he checked his BG by fingerstick for a few weeks, then stopped. He does not currently have a monitor at home.  Started metformin last week. Reports mild diarrhea, otherwise well tolerated.   Last eye exam: established with [facility]. Due for exam.  Last foot exam: due  Last A1c:  Lab Results  Component Value Date   HGBA1C 13.0 (H) 03/24/2023   Nephropathy screen indicated?: due  Last flu, zoster and/or pneumovax:  Immunization History  Administered Date(s) Administered   Influenza Split 12/08/2013   Influenza, Seasonal, Injecte, Preservative Fre 03/22/2023   Influenza,inj,Quad PF,6+ Mos 11/24/2020   Tdap 11/01/2022    ROS: Denies dizziness, LOC, polyuria, polydipsia, unintended weight loss/gain, foot ulcerations, numbness or tingling in extremities, shortness of breath or chest pain. Reports numbness in right hand due to carpal tunnel.     Relevant past medical, surgical, family, and social history reviewed and updated as indicated.  Allergies and medications reviewed and updated. Data reviewed: Chart in Epic.   Past Medical History:  Diagnosis Date   Depression     History reviewed. No pertinent surgical history.  Social History   Socioeconomic  History   Marital status: Married    Spouse name: Not on file   Number of children: Not on file   Years of education: Not on file   Highest education level: Not on file  Occupational History   Occupation: disability  Tobacco Use   Smoking status: Every Day    Current packs/day: 1.00    Average packs/day: 1 pack/day for 45.2 years (45.2 ttl pk-yrs)    Types: Cigarettes    Start date: 61   Smokeless tobacco: Never  Substance and Sexual Activity   Alcohol use: Not Currently   Drug use: Yes    Types: Marijuana, Hydrocodone   Sexual activity: Not Currently  Other Topics Concern   Not on file  Social History Narrative   Lives in a camper and ride a bicycle. No vehicle   Social Drivers of Corporate investment banker Strain: Low Risk  (11/24/2022)   Overall Financial Resource Strain (CARDIA)    Difficulty of Paying Living Expenses: Not hard at all  Food Insecurity: No Food Insecurity (11/24/2022)   Hunger Vital Sign    Worried About Running Out of Food in the Last Year: Never true    Ran Out of Food in the Last Year: Never true  Transportation Needs: Unmet Transportation Needs (11/25/2022)   PRAPARE - Transportation    Lack of Transportation (Medical): Yes    Lack of Transportation (Non-Medical): Yes  Physical Activity: Sufficiently Active (11/24/2022)   Exercise Vital Sign    Days of Exercise per Week: 7 days    Minutes of Exercise  per Session: 30 min  Stress: No Stress Concern Present (11/24/2022)   [name] of Occupational Health - Occupational Stress Questionnaire    Feeling of Stress : Only a little  Social Connections: Socially Isolated (11/24/2022)   Social Connection and Isolation Panel [NHANES]    Frequency of Communication with Friends and Family: More than three times a week    Frequency of Social Gatherings with Friends and Family: More than three times a week    Attends Religious Services: Never    Database administrator or Organizations: No     Attends Banker Meetings: Never    Marital Status: Divorced  Catering manager Violence: Not At Risk (11/24/2022)   Humiliation, Afraid, Rape, and Kick questionnaire    Fear of Current or Ex-Partner: No    Emotionally Abused: No    Physically Abused: No    Sexually Abused: No    Outpatient Encounter Medications as of 03/29/2023  Medication Sig   metFORMIN (GLUCOPHAGE) 500 MG tablet Take 1 tablet (500 mg total) by mouth 2 (two) times daily with a meal.   [START ON 05/01/2023] nicotine (NICODERM CQ) 14 mg/24hr patch Place 1 patch (14 mg total) onto the skin daily.   nicotine (NICODERM CQ) 21 mg/24hr patch Place 1 patch (21 mg total) onto the skin daily.   [START ON 05/06/2023] nicotine (NICODERM CQ) 7 mg/24hr patch Place 1 patch (7 mg total) onto the skin daily.   No facility-administered encounter medications on file as of 03/29/2023.    No Known Allergies  Review of Systems As per HPI  Objective:  BP 119/68   Pulse 94   Temp 98.9 F (37.2 C)   Ht 5\' 8"  (1.727 m)   Wt 188 lb (85.3 kg)   SpO2 94%   BMI 28.59 kg/m    Wt Readings from Last 3 Encounters:  03/29/23 188 lb (85.3 kg)  03/22/23 190 lb (86.2 kg)  02/17/23 196 lb 9.6 oz (89.2 kg)   Physical Exam Constitutional:      General: He is awake. He is not in acute distress.    Appearance: Normal appearance. He is well-developed, well-groomed and overweight. He is not ill-appearing, toxic-appearing or diaphoretic.  Cardiovascular:     Rate and Rhythm: Normal rate and regular rhythm.     Pulses: Normal pulses.          Radial pulses are 2+ on the right side and 2+ on the left side.       Posterior tibial pulses are 2+ on the right side and 2+ on the left side.     Heart sounds: Normal heart sounds. No murmur heard.    No gallop.  Pulmonary:     Effort: Pulmonary effort is normal. No respiratory distress.     Breath sounds: Normal breath sounds. No stridor. No wheezing, rhonchi or rales.  Musculoskeletal:      Cervical back: Full passive range of motion without pain and neck supple.     Right lower leg: No edema.     Left lower leg: No edema.       Feet:  Feet:     Right foot:     Protective Sensation: 10 sites tested.  10 sites sensed.     Skin integrity: Callus present. No ulcer, blister, skin breakdown, erythema, warmth, dry skin or fissure.     Toenail Condition: Right toenails are abnormally thick and long. Fungal disease present.    Left foot:  Protective Sensation: 10 sites tested.  9 sites sensed.     Skin integrity: Skin breakdown, callus and dry skin present. No ulcer, blister, erythema, warmth or fissure.     Toenail Condition: Left toenails are abnormally thick and long. Fungal disease present.    Comments: Dry, flaky, hardened bilateral heels  Callus along bony prominence of left foot and medial aspect of great toe of left foot  Skin:    General: Skin is warm.     Capillary Refill: Capillary refill takes less than 2 seconds.  Neurological:     General: No focal deficit present.     Mental Status: He is alert, oriented to person, place, and time and easily aroused. Mental status is at baseline.     GCS: GCS eye subscore is 4. GCS verbal subscore is 5. GCS motor subscore is 6.     Motor: No weakness.  Psychiatric:        Attention and Perception: Attention and perception normal.        Mood and Affect: Mood and affect normal.        Speech: Speech normal.        Behavior: Behavior normal. Behavior is cooperative.        Thought Content: Thought content normal. Thought content does not include homicidal or suicidal ideation. Thought content does not include homicidal or suicidal plan.        Cognition and Memory: Cognition and memory normal.        Judgment: Judgment normal.     Results for orders placed or performed in visit on 03/24/23  Bayer DCA Hb A1c Waived   Collection Time: 03/24/23  8:26 AM  Result Value Ref Range   HB A1C (BAYER DCA - WAIVED) 13.0 (H) 4.8 - 5.6  %  Microscopic Examination   Collection Time: 03/24/23  8:31 AM   Urine  Result Value Ref Range   WBC, UA 0-5 0 - 5 /hpf   RBC, Urine 0-2 0 - 2 /hpf   Epithelial Cells (non renal) 0-10 0 - 10 /hpf   Renal Epithel, UA None seen None seen /hpf   Bacteria, UA None seen None seen/Few   Yeast, UA None seen None seen  Glucose Hemocue Waived   Collection Time: 03/24/23  8:31 AM  Result Value Ref Range   Glu Hemocue Waived 440 (H) 70 - 99 mg/dL  Urinalysis, Routine w reflex microscopic   Collection Time: 03/24/23  8:31 AM  Result Value Ref Range   Specific Gravity, UA 1.010 1.005 - 1.030   pH, UA 5.0 5.0 - 7.5   Color, UA Yellow Yellow   Appearance Ur Clear Clear   Leukocytes,UA Negative Negative   Protein,UA Trace (A) Negative/Trace   Glucose, UA Negative Negative   Ketones, UA 1+ (A) Negative   RBC, UA Trace (A) Negative   Bilirubin, UA Negative Negative   Urobilinogen, Ur 0.2 0.2 - 1.0 mg/dL   Nitrite, UA Negative Negative   Microscopic Examination See below:   CMP14+EGFR   Collection Time: 03/24/23  8:32 AM  Result Value Ref Range   Glucose 446 (HH) 70 - 99 mg/dL   BUN 20 8 - 27 mg/dL   Creatinine, Ser 1.61 (H) 0.76 - 1.27 mg/dL   eGFR 50 (L) >09 UE/AVW/0.98   BUN/Creatinine Ratio 13 10 - 24   Sodium 130 (L) 134 - 144 mmol/L   Potassium 5.0 3.5 - 5.2 mmol/L   Chloride 93 (L) 96 - 106  mmol/L   CO2 24 20 - 29 mmol/L   Calcium 9.9 8.6 - 10.2 mg/dL   Total Protein 6.6 6.0 - 8.5 g/dL   Albumin 4.7 3.8 - 4.9 g/dL   Globulin, Total 1.9 1.5 - 4.5 g/dL   Bilirubin Total 0.6 0.0 - 1.2 mg/dL   Alkaline Phosphatase 138 (H) 44 - 121 IU/L   AST 35 0 - 40 IU/L   ALT 54 (H) 0 - 44 IU/L       03/22/2023   11:09 AM 02/17/2023    1:55 PM 11/24/2022    2:34 PM 08/09/2022    2:30 PM 06/26/2021    2:54 PM  Depression screen PHQ 2/9  Decreased Interest 2 0 0 0   Down, Depressed, Hopeless 0 0 0 0 3  PHQ - 2 Score 2 0 0 0 3  Altered sleeping 0 0 0 0 1  Tired, decreased energy 2 0 0 0 0   Change in appetite 0 0 0 0 1  Feeling bad or failure about yourself  0 0 0 0 0  Trouble concentrating 1 0 0 0 2  Moving slowly or fidgety/restless 0 0 0 0   Suicidal thoughts 0 0 0 0 2  PHQ-9 Score 5 0 0 0 9  Difficult doing work/chores  Not difficult at all Not difficult at all Not difficult at all Somewhat difficult       03/22/2023   11:13 AM 02/17/2023    1:56 PM 08/09/2022    2:31 PM 06/26/2021    2:57 PM  GAD 7 : Generalized Anxiety Score  Nervous, Anxious, on Edge 1 0 0 3  Control/stop worrying 0 0 0 3  Worry too much - different things 0 0 0 3  Trouble relaxing 0 0 0 1  Restless 1 0 0 0  Easily annoyed or irritable 0 0 0 1  Afraid - awful might happen 0 0 0 2  Total GAD 7 Score 2 0 0 13  Anxiety Difficulty Somewhat difficult Not difficult at all Not difficult at all Very difficult    Pertinent labs & imaging results that were available during my care of the patient were reviewed by me and considered in my medical decision making.  Assessment & Plan:  Timothy Blackwell was seen today for medical management of chronic issues.  Diagnoses and all orders for this visit:  Type 2 diabetes mellitus with other specified complication, without long-term current use of insulin (HCC) Will start medication as below. Patient to schedule with Triage RN for training on mounjaro injections. He is advised to monitor BG daily in am while fasting. Instructed patient to record BG measurements for review. Referral placed for follow up with pharmacist. Labs as below. Will communicate results to patient once available. Will await results to determine next steps.  Tolerating metformin. Continue on current dose. Will aim to increase as tolerated.  -     Blood Glucose Monitoring Suppl DEVI; 1 each by Does not apply route in the morning, at noon, and at bedtime. May substitute to any manufacturer covered by patient's insurance. -     Glucose Blood (BLOOD GLUCOSE TEST STRIPS) STRP; 1 each by In Vitro route in the  morning, at noon, and at bedtime. May substitute to any manufacturer covered by patient's insurance. -     Lancet Device MISC; 1 each by Does not apply route in the morning, at noon, and at bedtime. May substitute to any manufacturer covered by patient's insurance. -  Lancets Misc. MISC; 1 each by Does not apply route in the morning, at noon, and at bedtime. May substitute to any manufacturer covered by patient's insurance. -     Microalbumin / creatinine urine ratio -     tirzepatide (MOUNJARO) 2.5 MG/0.5ML Pen; Inject 2.5 mg into the skin once a week for 4 doses. -     AMB Referral VBCI Care Management  Uncontrolled type 2 diabetes mellitus with hyperglycemia (HCC) As above.  -     Blood Glucose Monitoring Suppl DEVI; 1 each by Does not apply route in the morning, at noon, and at bedtime. May substitute to any manufacturer covered by patient's insurance. -     Glucose Blood (BLOOD GLUCOSE TEST STRIPS) STRP; 1 each by In Vitro route in the morning, at noon, and at bedtime. May substitute to any manufacturer covered by patient's insurance. -     Lancet Device MISC; 1 each by Does not apply route in the morning, at noon, and at bedtime. May substitute to any manufacturer covered by patient's insurance. -     Lancets Misc. MISC; 1 each by Does not apply route in the morning, at noon, and at bedtime. May substitute to any manufacturer covered by patient's insurance. -     Microalbumin / creatinine urine ratio -     tirzepatide (MOUNJARO) 2.5 MG/0.5ML Pen; Inject 2.5 mg into the skin once a week for 4 doses. -     AMB Referral VBCI Care Management  Diabetes mellitus treated with oral medication (HCC) As above.  -     Blood Glucose Monitoring Suppl DEVI; 1 each by Does not apply route in the morning, at noon, and at bedtime. May substitute to any manufacturer covered by patient's insurance. -     Glucose Blood (BLOOD GLUCOSE TEST STRIPS) STRP; 1 each by In Vitro route in the morning, at noon, and  at bedtime. May substitute to any manufacturer covered by patient's insurance. -     Lancet Device MISC; 1 each by Does not apply route in the morning, at noon, and at bedtime. May substitute to any manufacturer covered by patient's insurance. -     Lancets Misc. MISC; 1 each by Does not apply route in the morning, at noon, and at bedtime. May substitute to any manufacturer covered by patient's insurance. -     Microalbumin / creatinine urine ratio -     tirzepatide (MOUNJARO) 2.5 MG/0.5ML Pen; Inject 2.5 mg into the skin once a week for 4 doses. -     AMB Referral VBCI Care Management  Callus of foot Referral placed as below as patient has diabetes and multiple calluses.  -     Ambulatory referral to Podiatry  Continue all other maintenance medications.  Follow up plan: Return in about 4 weeks (around 04/26/2023) for diabetes follow up.  Continue healthy lifestyle choices, including diet (rich in fruits, vegetables, and lean proteins, and low in salt and simple carbohydrates) and exercise (at least 30 minutes of moderate physical activity daily).  Written and verbal instructions provided   The above assessment and management plan was discussed with the patient. The patient verbalized understanding of and has agreed to the management plan. Patient is aware to call the clinic if they develop any new symptoms or if symptoms persist or worsen. Patient is aware when to return to the clinic for a follow-up visit. Patient educated on when it is appropriate to go to the emergency department.   Timothy Blackwell  Ellamae Sia, DNP-FNP [facility] [address] [place], [place] [address] [phone]

## 2023-03-30 ENCOUNTER — Encounter (HOSPITAL_COMMUNITY): Payer: Self-pay

## 2023-03-30 ENCOUNTER — Ambulatory Visit (HOSPITAL_COMMUNITY)
Admission: RE | Admit: 2023-03-30 | Discharge: 2023-03-30 | Disposition: A | Discharge status: Home / Self Care | Source: Ambulatory Visit | Attending: Orthopedic Surgery | Admitting: Orthopedic Surgery

## 2023-03-30 DIAGNOSIS — M25562 Pain in left knee: Secondary | ICD-10-CM

## 2023-03-30 LAB — MICROALBUMIN / CREATININE URINE RATIO
Creatinine, Urine: 201.8 mg/dL
Microalb/Creat Ratio: 54 mg/g{creat} — ABNORMAL HIGH (ref 0–29)
Microalbumin, Urine: 109.7 ug/mL

## 2023-03-30 MED ORDER — LISINOPRIL 2.5 MG PO TABS: 2.5000 mg | ORAL_TABLET | Freq: Every day | ORAL | 0 refills | Status: DC

## 2023-03-30 NOTE — Progress Notes (Signed)
 Elevated microalbumin./creatinine. Will send in low dose lisinopril for patient to start to prevent kidney damage. Will repeat in 3 months to monitor for improvement, if remains elevated will refer to nephrology.

## 2023-03-30 NOTE — Addendum Note (Signed)
 Addended by: Neale Burly on: 03/30/2023 02:44 PM   Modules accepted: Orders

## 2023-04-01 ENCOUNTER — Telehealth: Payer: Self-pay

## 2023-04-01 NOTE — Progress Notes (Signed)
 Care Guide Pharmacy Note  04/07/2023 Name: TAIKI BUCKWALTER MRN: [id number] DOB: 08-30-1960  Referred By: Arrie Senate, FNP Reason for referral: Care Coordination (Outreach to schedule with Pharm  d)   GRIFFITH SANTILLI is a 63 y.o. year old male who is a primary care patient of Arrie Senate, FNP.  Virgil Benedict was referred to the pharmacist for assistance related to: DMII  Successful contact was made with the patient to discuss pharmacy services including being ready for the pharmacist to call at least 5 minutes before the scheduled appointment time and to have medication bottles and any blood pressure readings ready for review. The patient agreed to meet with the pharmacist via telephone visit on (date/time).05/12/2023  Penne Lash , RMA     [facility] Guide  Direct Dial: [phone]  Website: [email or web address]

## 2023-04-05 ENCOUNTER — Ambulatory Visit

## 2023-04-06 ENCOUNTER — Other Ambulatory Visit (HOSPITAL_COMMUNITY): Payer: Self-pay

## 2023-04-06 ENCOUNTER — Ambulatory Visit (HOSPITAL_COMMUNITY)
Admission: RE | Admit: 2023-04-06 | Discharge: 2023-04-06 | Disposition: A | Discharge status: Home / Self Care | Source: Ambulatory Visit | Attending: Orthopedic Surgery | Admitting: Orthopedic Surgery

## 2023-04-06 ENCOUNTER — Telehealth: Payer: Self-pay

## 2023-04-06 DIAGNOSIS — M25462 Effusion, left knee: Secondary | ICD-10-CM | POA: Diagnosis not present

## 2023-04-06 DIAGNOSIS — M25562 Pain in left knee: Secondary | ICD-10-CM | POA: Insufficient documentation

## 2023-04-06 DIAGNOSIS — S83242A Other tear of medial meniscus, current injury, left knee, initial encounter: Secondary | ICD-10-CM | POA: Diagnosis not present

## 2023-04-06 NOTE — Telephone Encounter (Signed)
 Pharmacy Patient Advocate Encounter  Received notification from [facility] that Prior Authorization for [facility] 2.5MG /0.5ML auto-injectors has been APPROVED from 03/31/23 to 10/31/23. Unable to obtain price due to refill too soon rejection, last fill date 03/31/23 next available fill date04/17/25   PA #/Case ID/Reference #: --

## 2023-04-08 ENCOUNTER — Encounter: Payer: Self-pay | Admitting: Family Medicine

## 2023-04-08 ENCOUNTER — Telehealth: Payer: Self-pay

## 2023-04-08 ENCOUNTER — Ambulatory Visit (HOSPITAL_COMMUNITY): Admission: RE | Admit: 2023-04-08 | Source: Ambulatory Visit

## 2023-04-08 ENCOUNTER — Ambulatory Visit

## 2023-04-08 ENCOUNTER — Ambulatory Visit (INDEPENDENT_AMBULATORY_CARE_PROVIDER_SITE_OTHER): Admitting: Family Medicine

## 2023-04-08 ENCOUNTER — Ambulatory Visit (HOSPITAL_COMMUNITY)

## 2023-04-08 VITALS — BP 116/72 | HR 92 | Temp 98.5°F | Ht 68.0 in | Wt 188.0 lb

## 2023-04-08 DIAGNOSIS — R404 Transient alteration of awareness: Secondary | ICD-10-CM | POA: Diagnosis not present

## 2023-04-08 DIAGNOSIS — R413 Other amnesia: Secondary | ICD-10-CM | POA: Diagnosis not present

## 2023-04-08 LAB — GLUCOSE HEMOCUE WAIVED: Glu Hemocue Waived: 236 mg/dL — ABNORMAL HIGH (ref 70–99)
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
## 2023-04-08 NOTE — Telephone Encounter (Signed)
 Noted. Will discuss at appointment LS
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
## 2023-04-15 ENCOUNTER — Other Ambulatory Visit: Payer: Self-pay | Admitting: Family Medicine

## 2023-04-15 DIAGNOSIS — E1169 Type 2 diabetes mellitus with other specified complication: Secondary | ICD-10-CM

## 2023-04-26 ENCOUNTER — Ambulatory Visit: Admitting: Family Medicine

## 2023-04-29 DIAGNOSIS — M25561 Pain in right knee: Secondary | ICD-10-CM | POA: Diagnosis not present

## 2023-05-04 ENCOUNTER — Other Ambulatory Visit: Payer: Self-pay | Admitting: Family Medicine

## 2023-05-04 DIAGNOSIS — E1169 Type 2 diabetes mellitus with other specified complication: Secondary | ICD-10-CM

## 2023-05-04 DIAGNOSIS — E1165 Type 2 diabetes mellitus with hyperglycemia: Secondary | ICD-10-CM

## 2023-05-04 DIAGNOSIS — E119 Type 2 diabetes mellitus without complications: Secondary | ICD-10-CM

## 2023-05-06 ENCOUNTER — Encounter: Payer: Self-pay | Admitting: *Deleted

## 2023-05-12 ENCOUNTER — Other Ambulatory Visit

## 2023-05-25 ENCOUNTER — Telehealth: Payer: Self-pay

## 2023-05-25 NOTE — Progress Notes (Signed)
 Complex Care Management Care Guide Note  05/25/2023 Name: Timothy Blackwell MRN: [id number] DOB: 1960/12/23  Timothy Blackwell is a 63 y.o. year old male who is a primary care patient of Rosalynn Come, Winda Hastings, FNP and is actively engaged with the care management team. I reached out to Timothy Blackwell by phone today to assist with re-scheduling  with the Pharmacist.  Follow up plan: Unsuccessful telephone outreach attempt made. A HIPAA compliant phone message was left for the patient providing contact information and requesting a return call.  Lenton Rail , RMA     [facility]  [facility], [facility] Guide  Direct Dial: [phone]  Website: [email or web address]

## 2023-06-07 NOTE — Progress Notes (Signed)
 Complex Care Management Care Guide Note  06/07/2023 Name: Timothy Blackwell MRN: [id number] DOB: September 01, 1960  Timothy Blackwell is a 62 y.o. year old male who is a primary care patient of Rosalynn Come, Winda Hastings, FNP and is actively engaged with the care management team. I reached out to Timothy Blackwell by phone today to assist with re-scheduling  with the Pharmacist.  Follow up plan: Unsuccessful telephone outreach attempt made. A HIPAA compliant phone message was left for the patient providing contact information and requesting a return call.  Lenton Rail , RMA     [facility]  [facility], [facility] Guide  Direct Dial: [phone]  Website: [email or web address]

## 2023-06-26 ENCOUNTER — Other Ambulatory Visit: Payer: Self-pay | Admitting: Family Medicine

## 2023-06-26 DIAGNOSIS — E1129 Type 2 diabetes mellitus with other diabetic kidney complication: Secondary | ICD-10-CM

## 2023-10-26 NOTE — Progress Notes (Signed)
 Timothy Blackwell                                          MRN: [id number]   10/26/2023   The [facility] Quality Team Specialist reviewed this patient medical record for the purposes of chart review for care gap closure. The following were reviewed: chart review for care gap closure-glycemic status assessment.    [facility] Quality Team

## 2023-11-25 ENCOUNTER — Encounter: Payer: Medicare HMO | Admitting: Family

## 2023-11-25 ENCOUNTER — Ambulatory Visit: Payer: Medicare HMO

## 2024-01-10 ENCOUNTER — Ambulatory Visit

## 2024-01-10 NOTE — Progress Notes (Signed)
 Timothy Blackwell                                          MRN: [id number]   01/10/2024   The [facility] Quality Team Specialist reviewed this patient medical record for the purposes of chart review for care gap closure. The following were reviewed: chart review for care gap closure-glycemic status assessment.    [facility] Quality Team

## 2024-03-22 ENCOUNTER — Encounter: Payer: Self-pay | Admitting: Family Medicine
